# Patient Record
Sex: Female | Born: 1935 | Race: Black or African American | Hispanic: No | Smoking: Never smoker
Health system: Southern US, Community
[De-identification: ages and names within clinical notes are randomized; demographics above are authoritative.]

## PROBLEM LIST (undated history)

## (undated) DIAGNOSIS — Q809 Congenital ichthyosis, unspecified: Secondary | ICD-10-CM

## (undated) DIAGNOSIS — J449 Chronic obstructive pulmonary disease, unspecified: Secondary | ICD-10-CM

## (undated) DIAGNOSIS — J45909 Unspecified asthma, uncomplicated: Secondary | ICD-10-CM

## (undated) DIAGNOSIS — M1990 Unspecified osteoarthritis, unspecified site: Secondary | ICD-10-CM

## (undated) DIAGNOSIS — K922 Gastrointestinal hemorrhage, unspecified: Secondary | ICD-10-CM

## (undated) DIAGNOSIS — I10 Essential (primary) hypertension: Secondary | ICD-10-CM

## (undated) DIAGNOSIS — G4700 Insomnia, unspecified: Secondary | ICD-10-CM

## (undated) DIAGNOSIS — I4891 Unspecified atrial fibrillation: Secondary | ICD-10-CM

## (undated) DIAGNOSIS — I509 Heart failure, unspecified: Secondary | ICD-10-CM

## (undated) DIAGNOSIS — G629 Polyneuropathy, unspecified: Secondary | ICD-10-CM

## (undated) DIAGNOSIS — H3530 Unspecified macular degeneration: Secondary | ICD-10-CM

## (undated) DIAGNOSIS — I214 Non-ST elevation (NSTEMI) myocardial infarction: Secondary | ICD-10-CM

## (undated) DIAGNOSIS — E785 Hyperlipidemia, unspecified: Secondary | ICD-10-CM

## (undated) DIAGNOSIS — E790 Hyperuricemia without signs of inflammatory arthritis and tophaceous disease: Secondary | ICD-10-CM

## (undated) DIAGNOSIS — M810 Age-related osteoporosis without current pathological fracture: Secondary | ICD-10-CM

## (undated) DIAGNOSIS — E119 Type 2 diabetes mellitus without complications: Secondary | ICD-10-CM

## (undated) DIAGNOSIS — H409 Unspecified glaucoma: Secondary | ICD-10-CM

---

## 2007-05-06 ENCOUNTER — Ambulatory Visit: Payer: Self-pay | Admitting: Rheumatology

## 2007-05-27 ENCOUNTER — Ambulatory Visit: Payer: Self-pay | Admitting: Emergency Medicine

## 2007-06-12 ENCOUNTER — Encounter: Payer: Self-pay | Admitting: Emergency Medicine

## 2007-07-13 ENCOUNTER — Encounter: Payer: Self-pay | Admitting: Emergency Medicine

## 2007-07-31 ENCOUNTER — Ambulatory Visit: Payer: Self-pay | Admitting: Pain Medicine

## 2007-08-13 ENCOUNTER — Ambulatory Visit: Payer: Self-pay | Admitting: Pain Medicine

## 2007-08-28 ENCOUNTER — Ambulatory Visit: Payer: Self-pay | Admitting: Physician Assistant

## 2007-10-03 ENCOUNTER — Ambulatory Visit: Payer: Self-pay | Admitting: Pain Medicine

## 2007-10-22 ENCOUNTER — Ambulatory Visit: Payer: Self-pay | Admitting: Physician Assistant

## 2007-11-04 ENCOUNTER — Ambulatory Visit: Payer: Self-pay | Admitting: Pain Medicine

## 2007-11-05 ENCOUNTER — Ambulatory Visit: Payer: Self-pay | Admitting: Pain Medicine

## 2007-11-20 ENCOUNTER — Ambulatory Visit: Payer: Self-pay | Admitting: Physician Assistant

## 2007-12-03 ENCOUNTER — Ambulatory Visit: Payer: Self-pay | Admitting: Pain Medicine

## 2007-12-31 ENCOUNTER — Ambulatory Visit: Payer: Self-pay | Admitting: Physician Assistant

## 2008-01-06 ENCOUNTER — Encounter: Payer: Self-pay | Admitting: Pain Medicine

## 2008-01-10 ENCOUNTER — Encounter: Payer: Self-pay | Admitting: Pain Medicine

## 2008-02-10 ENCOUNTER — Encounter: Payer: Self-pay | Admitting: Pain Medicine

## 2008-02-22 IMAGING — CR PELVIS - 1-2 VIEW
1 series · 1 of 1 positions shown · non-contrast
Comparison: none

REASON FOR EXAM: pain in buttocks w/o injury
COMMENTS:

PROCEDURE:     MDR - MDR PELVIS AP ONLY  - May 27, 2007  [DATE]
RESULT:     No fracture or other significant osseous abnormality is seen. No
lytic or blastic lesions are noted. The hip joint spaces are bilaterally
symmetrical.

[view not recorded]
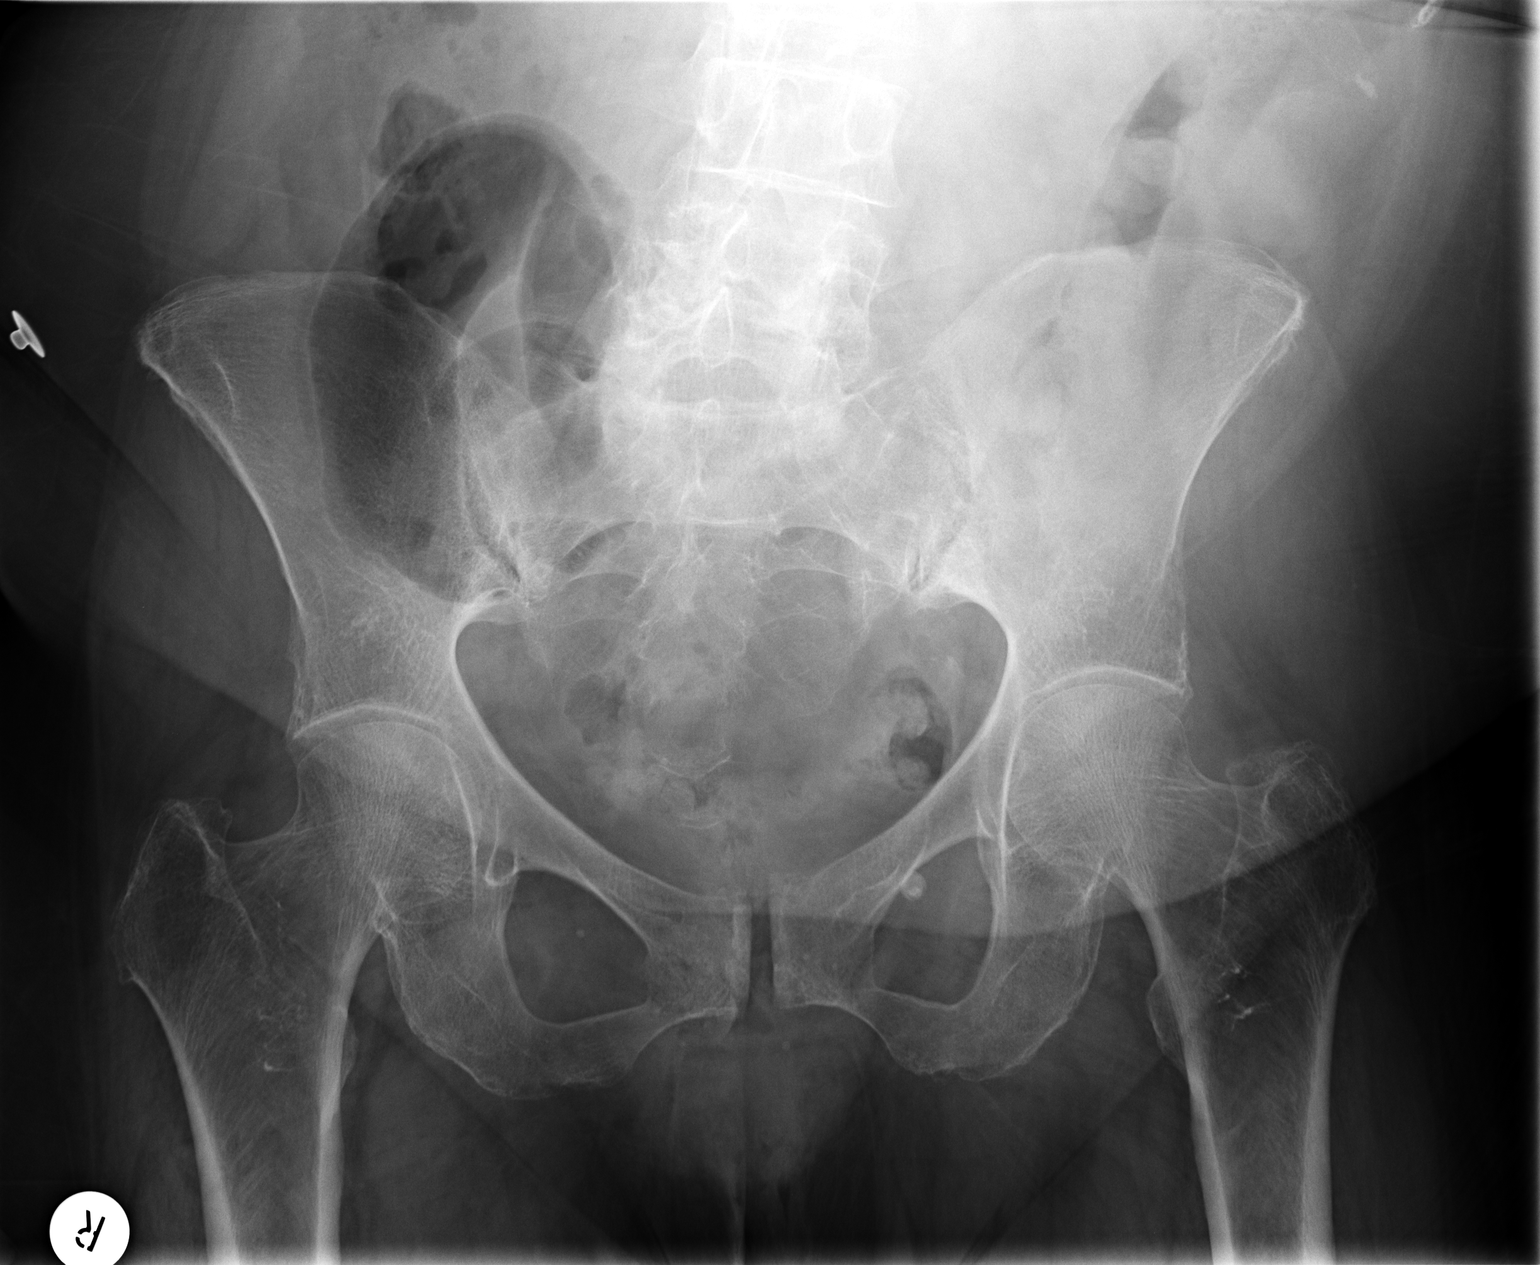

[1 of 1 positions shown; findings below may reference images not displayed]

IMPRESSION: 1.     No acute changes are identified.

## 2008-02-22 IMAGING — CR RIGHT HIP - COMPLETE 2+ VIEW
1 series · 2 of 2 positions shown · non-contrast
Comparison: none

REASON FOR EXAM: pain in hip w/o injury
COMMENTS:

PROCEDURE:     MDR - MDR HIP RIGHT COMPLETE  - May 27, 2007  [DATE]
RESULT:      No fracture, dislocation or other acute bony abnormality is
seen. No lytic or blastic lesions are noted.

[Series 1: view not recorded · 0.17mm/px · 2 of 2 slices shown]
[im 1/2]
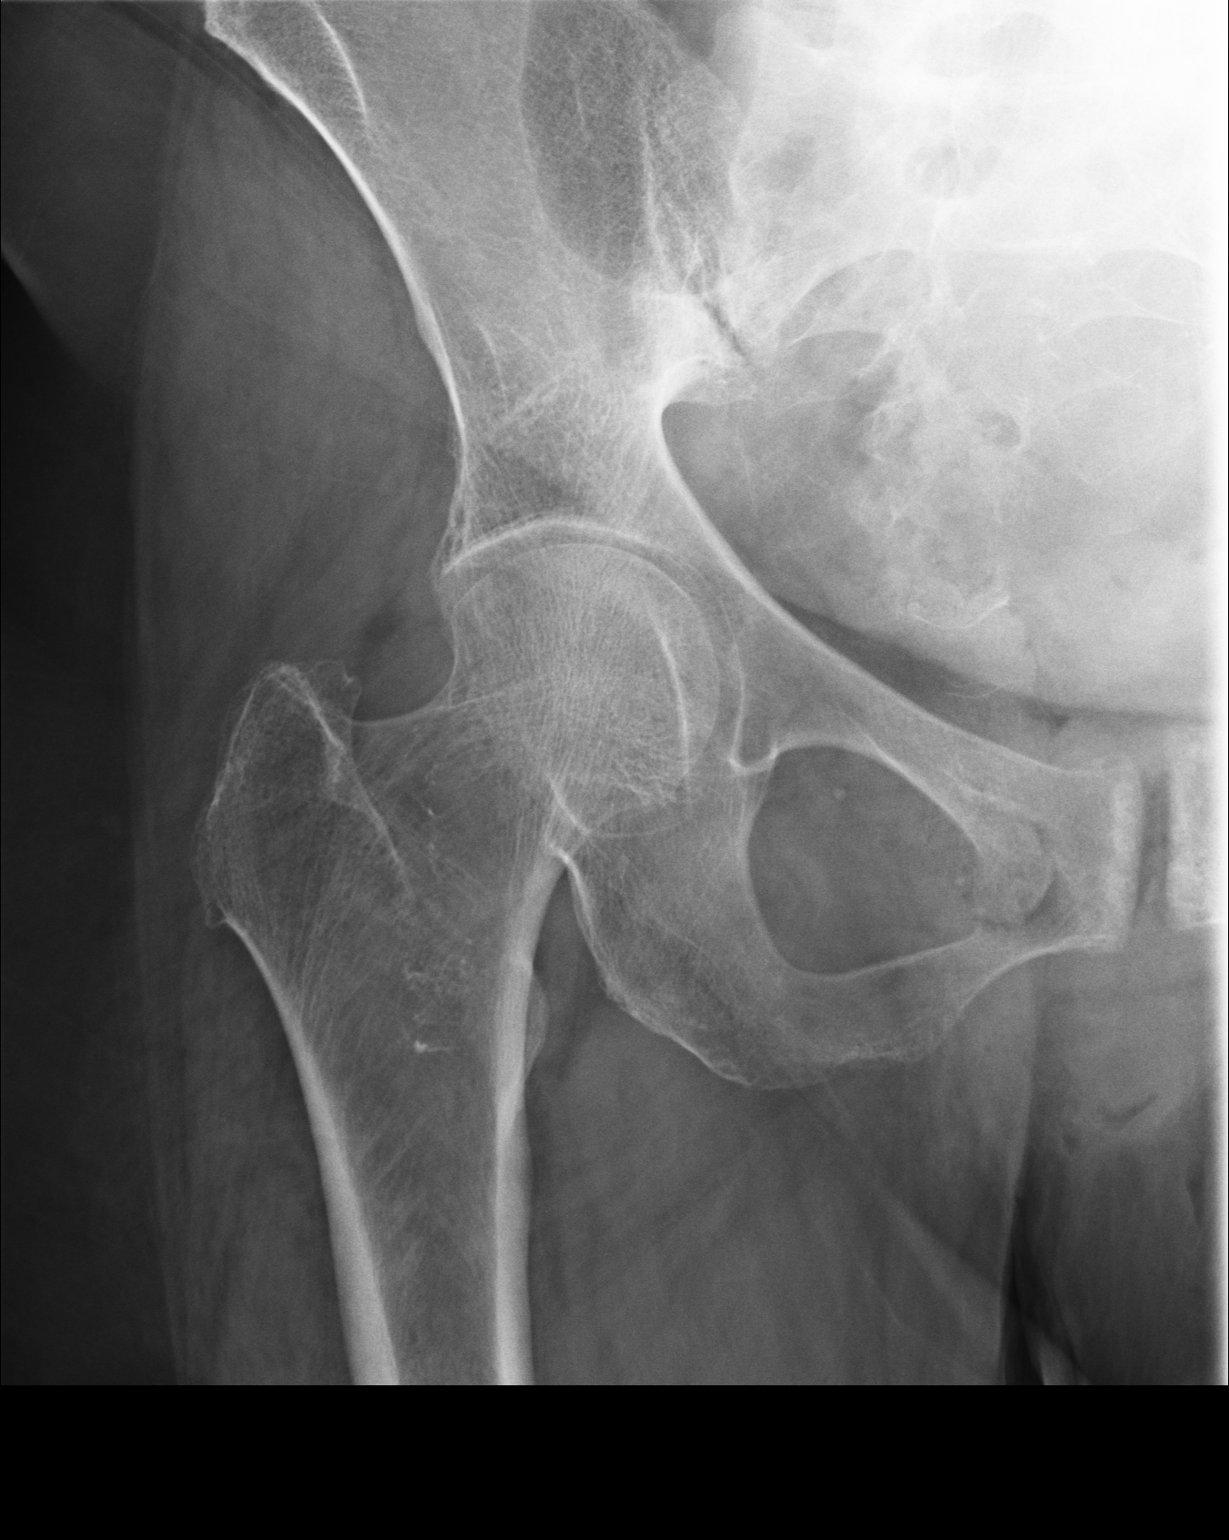
[im 2/2]
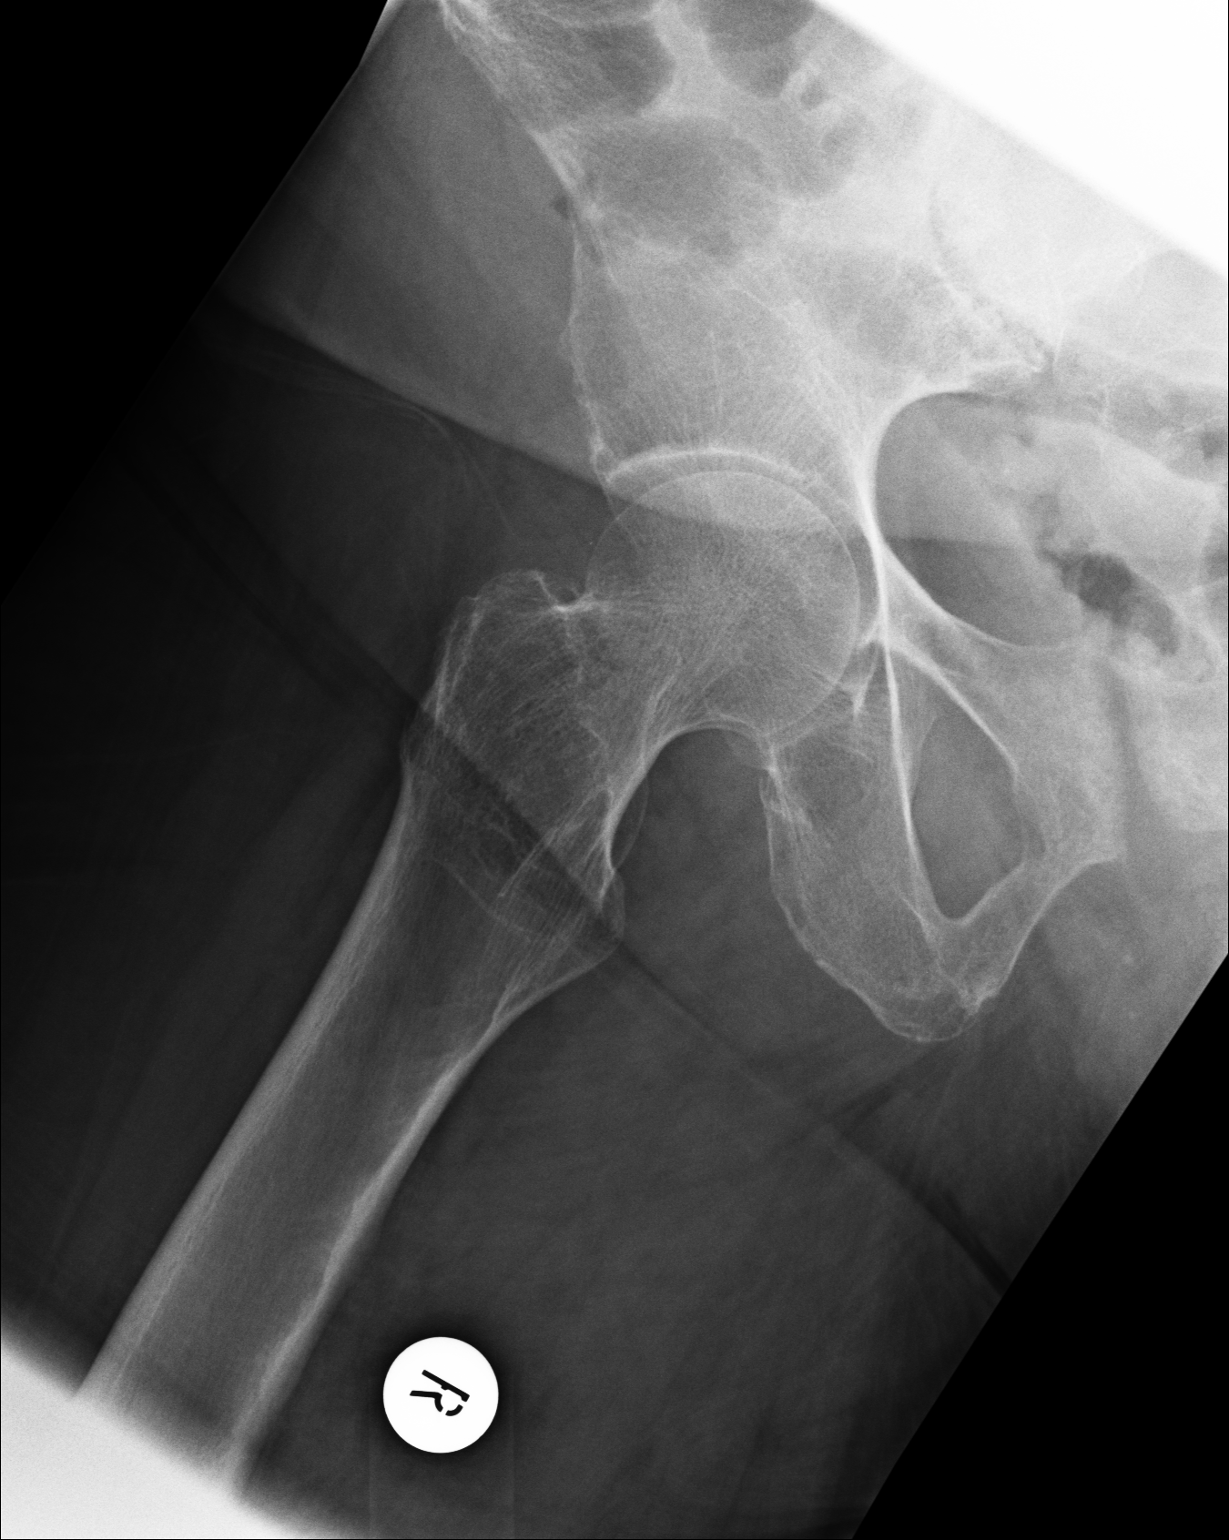

[2 of 2 positions shown; findings below may reference images not displayed]

IMPRESSION: No significant osseous abnormalities are noted.

## 2008-06-12 ENCOUNTER — Other Ambulatory Visit: Payer: Self-pay

## 2008-06-12 ENCOUNTER — Ambulatory Visit: Payer: Self-pay | Admitting: Family Medicine

## 2008-06-12 ENCOUNTER — Inpatient Hospital Stay: Payer: Self-pay | Admitting: Vascular Surgery

## 2008-06-20 ENCOUNTER — Other Ambulatory Visit: Payer: Self-pay

## 2008-06-20 ENCOUNTER — Inpatient Hospital Stay: Payer: Self-pay | Admitting: Internal Medicine

## 2008-06-21 ENCOUNTER — Other Ambulatory Visit: Payer: Self-pay

## 2008-07-21 ENCOUNTER — Ambulatory Visit: Payer: Self-pay | Admitting: Unknown Physician Specialty

## 2011-11-13 ENCOUNTER — Encounter (INDEPENDENT_AMBULATORY_CARE_PROVIDER_SITE_OTHER): Payer: Medicare Other | Admitting: Ophthalmology

## 2011-11-13 DIAGNOSIS — E1139 Type 2 diabetes mellitus with other diabetic ophthalmic complication: Secondary | ICD-10-CM

## 2011-11-13 DIAGNOSIS — H43819 Vitreous degeneration, unspecified eye: Secondary | ICD-10-CM

## 2011-11-13 DIAGNOSIS — E11319 Type 2 diabetes mellitus with unspecified diabetic retinopathy without macular edema: Secondary | ICD-10-CM

## 2011-11-13 DIAGNOSIS — H3530 Unspecified macular degeneration: Secondary | ICD-10-CM

## 2012-11-18 ENCOUNTER — Ambulatory Visit (INDEPENDENT_AMBULATORY_CARE_PROVIDER_SITE_OTHER): Payer: Medicare Other | Admitting: Ophthalmology

## 2013-01-16 ENCOUNTER — Emergency Department: Payer: Self-pay | Admitting: Emergency Medicine

## 2013-02-17 ENCOUNTER — Encounter: Payer: Self-pay | Admitting: Family Medicine

## 2013-03-11 ENCOUNTER — Encounter: Payer: Self-pay | Admitting: Family Medicine

## 2014-05-08 ENCOUNTER — Ambulatory Visit: Payer: Self-pay | Admitting: Unknown Physician Specialty

## 2014-07-29 ENCOUNTER — Ambulatory Visit: Payer: Self-pay | Admitting: Unknown Physician Specialty

## 2014-09-27 LAB — COMPREHENSIVE METABOLIC PANEL
ALK PHOS: 58 U/L
ALT: 20 U/L
Albumin: 4 g/dL (ref 3.4–5.0)
Anion Gap: 13 (ref 7–16)
BUN: 22 mg/dL — ABNORMAL HIGH (ref 7–18)
Bilirubin,Total: 1.1 mg/dL — ABNORMAL HIGH (ref 0.2–1.0)
CO2: 24 mmol/L (ref 21–32)
CREATININE: 1.38 mg/dL — AB (ref 0.60–1.30)
Calcium, Total: 9.5 mg/dL (ref 8.5–10.1)
Chloride: 105 mmol/L (ref 98–107)
EGFR (African American): 46 — ABNORMAL LOW
EGFR (Non-African Amer.): 38 — ABNORMAL LOW
Glucose: 183 mg/dL — ABNORMAL HIGH (ref 65–99)
Osmolality: 291 (ref 275–301)
Potassium: 4.2 mmol/L (ref 3.5–5.1)
SGOT(AST): 39 U/L — ABNORMAL HIGH (ref 15–37)
Sodium: 142 mmol/L (ref 136–145)
Total Protein: 7.3 g/dL (ref 6.4–8.2)

## 2014-09-27 LAB — CBC WITH DIFFERENTIAL/PLATELET
Basophil #: 0 10*3/uL (ref 0.0–0.1)
Basophil %: 0.2 %
EOS ABS: 0.1 10*3/uL (ref 0.0–0.7)
Eosinophil %: 0.6 %
HCT: 45.6 % (ref 35.0–47.0)
HGB: 14.2 g/dL (ref 12.0–16.0)
LYMPHS PCT: 2.8 %
Lymphocyte #: 0.4 10*3/uL — ABNORMAL LOW (ref 1.0–3.6)
MCH: 28.8 pg (ref 26.0–34.0)
MCHC: 31.2 g/dL — ABNORMAL LOW (ref 32.0–36.0)
MCV: 92 fL (ref 80–100)
MONOS PCT: 6 %
Monocyte #: 0.8 x10 3/mm (ref 0.2–0.9)
NEUTROS ABS: 12.2 10*3/uL — AB (ref 1.4–6.5)
Neutrophil %: 90.4 %
Platelet: 233 10*3/uL (ref 150–440)
RBC: 4.95 10*6/uL (ref 3.80–5.20)
RDW: 14.6 % — ABNORMAL HIGH (ref 11.5–14.5)
WBC: 13.5 10*3/uL — ABNORMAL HIGH (ref 3.6–11.0)

## 2014-09-27 LAB — LIPASE, BLOOD: Lipase: 68 U/L — ABNORMAL LOW

## 2014-09-27 LAB — TROPONIN I: Troponin-I: <0.02 ng/mL

## 2014-09-28 ENCOUNTER — Observation Stay: Payer: Self-pay | Admitting: Specialist

## 2014-09-28 LAB — URINALYSIS, COMPLETE
BACTERIA: NONE SEEN
Bilirubin,UR: NEGATIVE
Blood: NEGATIVE
GLUCOSE, UR: NEGATIVE mg/dL (ref 0–75)
Hyaline Cast: 46
KETONE: NEGATIVE
LEUKOCYTE ESTERASE: NEGATIVE
Nitrite: NEGATIVE
Ph: 5 (ref 4.5–8.0)
Protein: NEGATIVE
SQUAMOUS EPITHELIAL: NONE SEEN
Specific Gravity: 1.017 (ref 1.003–1.030)

## 2014-09-28 LAB — CLOSTRIDIUM DIFFICILE(ARMC)

## 2014-09-29 LAB — BASIC METABOLIC PANEL
Anion Gap: 5 — ABNORMAL LOW (ref 7–16)
BUN: 16 mg/dL (ref 7–18)
Calcium, Total: 8 mg/dL — ABNORMAL LOW (ref 8.5–10.1)
Chloride: 111 mmol/L — ABNORMAL HIGH (ref 98–107)
Co2: 27 mmol/L (ref 21–32)
Creatinine: 1.08 mg/dL (ref 0.60–1.30)
EGFR (African American): >60
EGFR (Non-African Amer.): 51 — ABNORMAL LOW
Glucose: 97 mg/dL (ref 65–99)
Osmolality: 286 (ref 275–301)
Potassium: 4 mmol/L (ref 3.5–5.1)
Sodium: 143 mmol/L (ref 136–145)

## 2014-09-29 LAB — TSH: Thyroid Stimulating Horm: 0.929 u[IU]/mL

## 2015-01-10 NOTE — H&P (Signed)
PATIENT NAME:  Jodi Lewis, MCBETH MR#:  [id number] DATE OF BIRTH:  Feb 06, 1923  DATE OF ADMISSION:  09/28/2014  REFERRING PHYSICIAN: Rebecka Apley, M.D.   PRIMARY CARE PHYSICIAN: Volney American, M.D.   ADMIT DIAGNOSIS: Dehydration, acute kidney injury and lactic acidosis.   HISTORY OF PRESENT ILLNESS: This is an 81 year old, African American woman, who presents to the Emergency Department complaining of nausea and abdominal pain. The patient states that she had vomited multiple times throughout the day. Her emesis was nonbloody and nonbilious. She thinks that she ate some cornbread that may have been too greasy for her stomach. Nobody else in the house is sick. She has no sick contacts. Her laboratory evaluation revealed some acute kidney injury and physical examination showed dehydration. Following intravenous fluid, the patient was feeling much better. She no longer had abdominal pain, nor any nausea or vomiting. However, her lactic acid level was fairly high. This is in light of the fact that when the patient arrived, she was in atrial fibrillation with RVR, which also resolved after intravenous fluid. Due to a concern for tachyarrhythmia and its connection to her lactic acidosis, the Emergency Department called for admission.   REVIEW OF SYSTEMS:   CONSTITUTIONAL: The patient denies fever, but admits to some generalized weakness.  EYES: Admits to blurred vision, but this is a chronic problem, but denies inflammation.  ENT: Denies tinnitus or sore throat.  RESPIRATORY: Denies shortness of breath or cough.  CARDIOVASCULAR: Denies chest pain, orthopnea, or paroxysmal nocturnal dyspnea. The patient admits to palpitations earlier in the evening.  GASTROINTESTINAL: Admits to nausea and vomiting, as well as abdominal pain and diarrhea, all of which have resolved.  ENDOCRINE: Denies polyuria or polydipsia.  HEMATOLOGIC AND LYMPHATIC: Denies easy bruising or bleeding.  MUSCULOSKELETAL: Admits to  generalized aches and pains, but denies myalgias.  INTEGUMENTARY: Denies rashes or lesions.  NEUROLOGIC: Denies numbness in her extremities or dysarthria.  PSYCHIATRIC: Denies depression or suicidal ideation.   PAST MEDICAL HISTORY: Congestive heart failure, coronary artery disease status post myocardial infarction, hypertension, diabetes type 2, COPD, atrial fibrillation, hyperlipidemia, glaucoma, macular degeneration, osteoporosis, history of GI bleed, osteoarthritis, as well as xeroderma and hyperuricemia.   PAST SURGICAL HISTORY: Bilateral cataract removal.   SOCIAL HISTORY: The patient does not smoke, drink, or do any drugs. She lives with one of her daughters.   FAMILY HISTORY: Her son has diabetes mellitus.   MEDICATIONS:  1. Albuterol 90 mcg per actuation 2 puffs inhaled every 4 to 6 hours as needed for coughing, wheezing, or shortness of breath.  2. Beclomethasone 80 mcg/inhalation 2 puffs inhaled 2 times a day.  3. Calcium carbonate 600 mg 1 tablet p.o. b.i.d.  4. Folic acid 1 mg 1 tablet p.o. daily.  5. Furosemide 40 mg 1 tablet p.o. daily.  6. Omeprazole 20 mg delayed release 1 tablet p.o. daily.  7. Percocet 5/325 mg 1 tablet p.o. every 6 hours as needed for pain.  8. Potassium chloride 20 mEq extended release 1 tablet p.o. daily.  9. Quinapril 5 mg 1 tablet p.o. daily.  10. Simvastatin 20 mg 1 tablet p.o. daily.  11. Xalatan 0.005% ophthalmic solution 1 drop to each affected eye 3 times a day.  12. Zoledronic acid 5 mg/100 mL intravenous solution injected once a year, every January.   ALLERGIES: No known drug allergies.   PERTINENT LABORATORY RESULTS AND RADIOGRAPHIC FINDINGS: Serum glucose is 183, BUN 22, creatinine 1.38, serum sodium 142, potassium is 4.2, chloride is 105,  bicarbonate 24, calcium 9.5, lipase is 68, serum albumin is 4, alkaline phosphatase 58, AST 39, ALT 20.   Troponin is negative.   White blood cell count is 13.5, hemoglobin is 14.2, hematocrit 45.6,  MCV 92.   Urinalysis is negative for infection.   Clostridium difficile antigen is negative.   Venous lactic acid is 5.9.   CT of the abdomen and pelvis without contrast shows no acute intra-abdominal or pelvic process. There is diverticulosis without acute diverticulitis. There is also a 3.3 cm infrarenal aortic aneurysm extending to the common iliac artery. There is a large right diaphragmatic hernia, as well as a moderate to large hiatal hernia.   PHYSICAL EXAMINATION:  VITAL SIGNS: Temperature is 98.6, pulse 79, respirations 18, blood pressure 105/63, pulse oximetry is 98% on room air.  GENERAL: The patient is alert and oriented x 3, in no apparent distress.  HEENT: Normocephalic, atraumatic. Pupils equal, round, and reactive to light and accommodation. Extraocular movements are intact. Mucous membranes are moist.  NECK: Trachea is midline. No adenopathy. Thyroid is nonpalpable, nontender.  CHEST: Symmetric and atraumatic.  CARDIOVASCULAR: Regular rate and rhythm. Normal S1, S2. No rubs, clicks, or murmurs appreciated.  LUNGS: Clear to auscultation bilaterally. Normal effort and excursion.  ABDOMEN: Positive bowel sounds. Soft, nontender, nondistended. No hepatosplenomegaly. There are some ventral wall defects, but all hernias are reducible.  GENITOURINARY: Deferred.  MUSCULOSKELETAL: The patient has 5/5 strength in her lower extremities bilaterally. I have not tested her gait.  SKIN: Warm and dry. No rashes or lesions.  EXTREMITIES: No clubbing or cyanosis, but the patient has 2+ pitting edema of her lower extremities bilaterally.  NEUROLOGIC: Cranial nerves II through XII are grossly intact.  PSYCHIATRIC: Mood is normal. Affect is congruent. The patient has excellent insight and judgment into her medical condition.   ASSESSMENT AND PLAN: This is an 81 year old female admitted for dehydration, acute kidney injury, and lactic acidosis.   1. Dehydration, is likely secondary to vomiting  and diarrhea. Both symptoms have resolved and we will continue intravenous fluid at maintenance rate, at this time.  2. Acute kidney injury. This is likely secondary to dehydration. We will continue to hydrate the patient.  3. Lactic acidosis. The patient likely just has not cleared her lactic acid as quickly as one might expect. This is likely due to her advanced age. She probably has not yet fully volume resuscitated, which is why we will continue to give her intravenous fluid, being mindful of the fact that she has congestive heart failure. There is another concern, however, and that is that she may have suffered an embolic event to her gut while she was in atrial fibrillation with rapid ventricular response. Her physical examination is completely reassuring. There is no pain, at all, let alone pain that is out of proportion with physical examination. We will recheck her lactic acid a few hours after we think she is fully resuscitated. If she develops pain on serial abdominal examinations, or if the lactic acid continues to increase, we may need to perform a CT angiogram of the abdomen to rule out mesenteric ischemia. At this juncture, I feel like this is a low possibility.  4. Congestive heart failure is stable. This appears diastolic. We will continue Lasix per her home regimen.  5. Coronary artery disease. Continue secondary risk reduction.  6. Hypertension. Continue enalapril.  7. Diabetes type 2, sliding scale insulin while the patient is hospitalized.  8. Chronic obstructive pulmonary disease. Continue inhaled corticosteroid  therapy. We will continue albuterol, as needed.  9. Atrial fibrillation. Presumably the patient is not on any anticoagulant right now due her fall risk and/or history of gastrointestinal bleed. I will not place the patient on systemic anticoagulation; however, we will give her deep vein thrombosis prophylaxis in the form of heparin.  10. Glaucoma. Continue latanoprost.   11. Deep vein thrombosis prophylaxis, heparin.  12. Gastrointestinal prophylaxis, none.   CODE STATUS: The patient is a FULL CODE.   TIME SPENT ON ADMISSION ORDERS AND PATIENT CARE: Approximately 40 minutes.   ____________________________ Kelton PillarMichael S. Sheryle Hailiamond, MD msd:JT D: 09/28/2014 08:52:27 ET T: 09/28/2014 09:10:09 ET JOB#: [id number]  cc: Kelton PillarMichael S. Sheryle Hailiamond, MD, <Dictator>  Kelton PillarMICHAEL S Terria Deschepper MD ELECTRONICALLY SIGNED 10/06/2014 2:44

## 2015-01-10 NOTE — Discharge Summary (Signed)
PATIENT NAME:  Jodi Lewis, Akira P MR#:  [id number] DATE OF BIRTH:  1923/08/30  DATE OF ADMISSION:  09/28/2014 DATE OF DISCHARGE:  09/29/2014  For a detailed note, please see the history and physical done on admission by Dr. Kelton PillarMichael S. Diamond.   DIAGNOSES AT DISCHARGE: As follows: 1.  Diarrhea, likely secondary to a viral illness now resolved.  2.  Nausea, vomiting, abdominal pain, likely secondary to a viral illness also now resolved.  3.  Acute renal failure, likely secondary to her diarrhea improved.  4.  Dehydration.  5.  Hypertension.  6.  Glaucoma.   DISCHARGE DIET:  The patient is being discharged on a low-sodium, low-fat diet.   ACTIVITY: As tolerated.   FOLLOW-UP:  Dr. Trula Orehristina Drostin in the next 1 to 2 weeks.   DISCHARGE MEDICATIONS: Albuterol inhaler 2 puffs every 4 to 6 hours as needed, beclomethasone 80 mcg inhaled 2 puffs b.i.d., calcium carbonate 600 mg b.i.d., folic acid 1 mg daily, Lasix 40 mg daily, Xalatan 0.005% ophthalmic solution t.i.d. omeprazole 20 mg daily, Percocet 5/325 one tablet q.6 hours as needed, potassium 20 mEq daily, quinapril 5 mg daily, simvastatin 20 mg daily, zoledronic acid 5 mg intravenous every year in January.   Pertinent studies done during the hospital course:  Stool for Clostridium difficile noted to be negative, a CT scan of the abdomen and pelvis done with contrast showing no evidence of any intra-abdominal or pelvic pathology. Large right diaphragmatic hernia, moderate to large hiatal hernia, infrarenal aortic aneurysm extending to the common iliac.  Recommend follow-up and ultrasound in three years.   HOSPITAL COURSE: This is an 81 year old female who presented to the hospital with nausea, vomiting, abdominal pain, and diarrhea, noted to be in mild acute renal failure.   PROBLEMS: 1.  Diarrhea. This was likely acute diarrhea secondary to a viral illness. The patient had stool for Clostridium difficile checked, which were negative. The patient  was treated supportively with IV fluids and anti-diarrheal is likely Imodium. Her diarrhea overnight has significantly improved. She is tolerating now a regular diet, therefore, being discharged home.  2.  Abdominal pain, nausea, and vomiting. This is also secondary to the viral illness and this has significantly improved and resolved with supportive care.  As mentioned the patient's stool for Clostridium difficile was negative. The patient's diet has been slowly advanced from a clear liquid eventually to a regular diet, which she is tolerating well without any evidence of further abdominal pain, nausea or vomiting.  3.  Acute renal failure. This was likely secondary to diarrhea. This has now improved and resolved with IV fluids.  4.  Hypertension. The patient remained hemodynamically stable. She will continue her quinapril.  5.  Hyperlipidemia. The patient was maintained on her simvastatin, she will resume that.  6.  Glaucoma. The patient was maintained on latanoprost eye drops and she will also resume that upon discharge.   CODE STATUS:  The patient is a full code.   TIME SPENT: With the discharge was 35 minutes.   ____________________________ Rolly PancakeVivek J. Cherlynn KaiserSainani, MD vjs:at D: 09/29/2014 15:38:00 ET T: 09/29/2014 17:09:49 ET JOB#: [id number]  cc: Rolly PancakeVivek J. Cherlynn KaiserSainani, MD, <Dictator> Volney Americanhristina Drostin, MD [place] SirenVIVEK J Nathaniel Wakeley MD ELECTRONICALLY SIGNED 10/06/2014 11:54

## 2020-04-10 ENCOUNTER — Encounter: Payer: Self-pay | Admitting: Emergency Medicine

## 2020-04-10 ENCOUNTER — Other Ambulatory Visit: Payer: Self-pay

## 2020-04-10 ENCOUNTER — Emergency Department
Admission: EM | Admit: 2020-04-10 | Discharge: 2020-04-11 | Disposition: A | Discharge status: Home / Self Care | Payer: Medicare Other | Attending: Emergency Medicine | Admitting: Emergency Medicine

## 2020-04-10 ENCOUNTER — Emergency Department: Payer: Medicare Other

## 2020-04-10 DIAGNOSIS — F17200 Nicotine dependence, unspecified, uncomplicated: Secondary | ICD-10-CM | POA: Insufficient documentation

## 2020-04-10 DIAGNOSIS — E119 Type 2 diabetes mellitus without complications: Secondary | ICD-10-CM | POA: Diagnosis not present

## 2020-04-10 DIAGNOSIS — J449 Chronic obstructive pulmonary disease, unspecified: Secondary | ICD-10-CM | POA: Diagnosis not present

## 2020-04-10 DIAGNOSIS — I82412 Acute embolism and thrombosis of left femoral vein: Secondary | ICD-10-CM | POA: Insufficient documentation

## 2020-04-10 DIAGNOSIS — Z7901 Long term (current) use of anticoagulants: Secondary | ICD-10-CM | POA: Insufficient documentation

## 2020-04-10 DIAGNOSIS — R600 Localized edema: Secondary | ICD-10-CM | POA: Diagnosis not present

## 2020-04-10 DIAGNOSIS — M7989 Other specified soft tissue disorders: Secondary | ICD-10-CM | POA: Diagnosis present

## 2020-04-10 DIAGNOSIS — J45909 Unspecified asthma, uncomplicated: Secondary | ICD-10-CM | POA: Diagnosis not present

## 2020-04-10 DIAGNOSIS — I509 Heart failure, unspecified: Secondary | ICD-10-CM | POA: Diagnosis not present

## 2020-04-10 DIAGNOSIS — I110 Hypertensive heart disease with heart failure: Secondary | ICD-10-CM | POA: Insufficient documentation

## 2020-04-10 HISTORY — DX: Polyneuropathy, unspecified: G62.9

## 2020-04-10 HISTORY — DX: Unspecified osteoarthritis, unspecified site: M19.90

## 2020-04-10 HISTORY — DX: Unspecified asthma, uncomplicated: J45.909

## 2020-04-10 HISTORY — DX: Essential (primary) hypertension: I10

## 2020-04-10 HISTORY — DX: Unspecified atrial fibrillation: I48.91

## 2020-04-10 HISTORY — DX: Hyperuricemia without signs of inflammatory arthritis and tophaceous disease: E79.0

## 2020-04-10 HISTORY — DX: Chronic obstructive pulmonary disease, unspecified: J44.9

## 2020-04-10 HISTORY — DX: Congenital ichthyosis, unspecified: Q80.9

## 2020-04-10 HISTORY — DX: Non-ST elevation (NSTEMI) myocardial infarction: I21.4

## 2020-04-10 HISTORY — DX: Hyperlipidemia, unspecified: E78.5

## 2020-04-10 HISTORY — DX: Type 2 diabetes mellitus without complications: E11.9

## 2020-04-10 HISTORY — DX: Insomnia, unspecified: G47.00

## 2020-04-10 HISTORY — DX: Unspecified macular degeneration: H35.30

## 2020-04-10 HISTORY — DX: Unspecified glaucoma: H40.9

## 2020-04-10 HISTORY — DX: Heart failure, unspecified: I50.9

## 2020-04-10 HISTORY — DX: Age-related osteoporosis without current pathological fracture: M81.0

## 2020-04-10 HISTORY — DX: Gastrointestinal hemorrhage, unspecified: K92.2

## 2020-04-10 LAB — COMPREHENSIVE METABOLIC PANEL
ALT: 21 U/L (ref 0–44)
AST: 35 U/L (ref 15–41)
Albumin: 4.2 g/dL (ref 3.5–5.0)
Alkaline Phosphatase: 71 U/L (ref 38–126)
Anion gap: 10 (ref 5–15)
BUN: 31 mg/dL — ABNORMAL HIGH (ref 8–23)
CO2: 29 mmol/L (ref 22–32)
Calcium: 9.6 mg/dL (ref 8.9–10.3)
Chloride: 104 mmol/L (ref 98–111)
Creatinine, Ser: 1.68 mg/dL — ABNORMAL HIGH (ref 0.44–1.00)
GFR calc Af Amer: 30 mL/min — ABNORMAL LOW (ref >60)
GFR calc non Af Amer: 26 mL/min — ABNORMAL LOW (ref >60)
Glucose, Bld: 128 mg/dL — ABNORMAL HIGH (ref 70–99)
Potassium: 4.4 mmol/L (ref 3.5–5.1)
Sodium: 143 mmol/L (ref 135–145)
Total Bilirubin: 1.1 mg/dL (ref 0.3–1.2)
Total Protein: 6.9 g/dL (ref 6.5–8.1)

## 2020-04-10 LAB — CBC
HCT: 38 % (ref 36.0–46.0)
Hemoglobin: 11.8 g/dL — ABNORMAL LOW (ref 12.0–15.0)
MCH: 27.8 pg (ref 26.0–34.0)
MCHC: 31.1 g/dL (ref 30.0–36.0)
MCV: 89.6 fL (ref 80.0–100.0)
Platelets: 196 10*3/uL (ref 150–400)
RBC: 4.24 MIL/uL (ref 3.87–5.11)
RDW: 14.8 % (ref 11.5–15.5)
WBC: 6.1 10*3/uL (ref 4.0–10.5)
nRBC: 0 % (ref 0.0–0.2)

## 2020-04-10 LAB — TROPONIN I (HIGH SENSITIVITY): Troponin I (High Sensitivity): 15 ng/L (ref <18)

## 2020-04-10 LAB — BRAIN NATRIURETIC PEPTIDE: B Natriuretic Peptide: 169.3 pg/mL — ABNORMAL HIGH (ref 0.0–100.0)

## 2020-04-10 MED ORDER — FUROSEMIDE 10 MG/ML IJ SOLN
40.0000 mg | Freq: Once | INTRAMUSCULAR | Status: AC
  Administered 2020-04-11: 40 mg via INTRAVENOUS
  Filled 2020-04-10: qty 4

## 2020-04-10 NOTE — ED Provider Notes (Signed)
[facility] Emergency Department Provider Note  ____________________________________________  Time seen: Approximately 11:48 PM  I have reviewed the triage vital signs and the nursing notes.   HISTORY  Chief Complaint Leg Swelling   HPI Jodi Lewis is a 86 y.o. female with a history of CHF, COPD, asthma, hypertension, hyperlipidemia who presents for evaluation of bilateral leg swelling.  Patient reports chronic leg swelling however since yesterday, the swelling has been getting progressively worse.   She endorses compliance with her Lasix which she takes 40 mg daily.  Has been making normal amount of urine.  No shortness of breath, orthopnea, chest pain.  She denies any prior history of DVT or PE.  She is complaining that the swelling is now severe.  Past Medical History:  Diagnosis Date  . Arthritis   . Asthma   . Atrial fibrillation (HCC)   . CHF (congestive heart failure) (HCC)   . COPD (chronic obstructive pulmonary disease) (HCC)   . Diabetes mellitus without complication (HCC)   . GI bleed   . Glaucoma   . Hyperlipidemia   . Hypertension   . Hyperuricemia   . Insomnia   . Macular degeneration   . MI, acute, non ST segment elevation (HCC)   . Neuropathy   . Osteoporosis   . Xeroderma     There are no problems to display for this patient.   History reviewed. No pertinent surgical history.  Prior to Admission medications   Medication Sig Start Date End Date Taking? Authorizing Provider  apixaban (ELIQUIS) 2.5 MG TABS tablet Take 1 tablet (2.5 mg total) by mouth 2 (two) times daily. 04/11/20   Nita SickleVeronese, [place], MD    Allergies Patient has no known allergies.  No family history on file.  Social History Social History   Tobacco Use  . Smoking status: Never Smoker  . Smokeless tobacco: Current User    Types: Snuff  Substance Use Topics  . Alcohol use: Not Currently  . Drug use: Never    Review of  Systems  Constitutional: Negative for fever. Eyes: Negative for visual changes. ENT: Negative for sore throat. Neck: No neck pain  Cardiovascular: Negative for chest pain. Respiratory: Negative for shortness of breath. Gastrointestinal: Negative for abdominal pain, vomiting or diarrhea. Genitourinary: Negative for dysuria. Musculoskeletal: Negative for back pain. + Bilateral leg swelling Skin: Negative for rash. Neurological: Negative for headaches, weakness or numbness. Psych: No SI or HI  ____________________________________________   PHYSICAL EXAM:  VITAL SIGNS: ED Triage Vitals  Enc Vitals Group     BP 04/10/20 1926 101/76     Pulse Rate 04/10/20 1926 80     Resp 04/10/20 1926 18     Temp 04/10/20 1926 99 F (37.2 C)     Temp Source 04/10/20 1926 Oral     SpO2 04/10/20 1926 98 %     Weight 04/10/20 1927 139 lb (63 kg)     Height 04/10/20 1927 5\' 3"  (1.6 m)     Head Circumference --      Peak Flow --      Pain Score 04/10/20 1926 10     Pain Loc --      Pain Edu? --      Excl. in GC? --     Constitutional: Alert and oriented. Well appearing and in no apparent distress. HEENT:      Head: Normocephalic and atraumatic.         Eyes: Conjunctivae are normal. Sclera  is non-icteric.       Mouth/Throat: Mucous membranes are moist.       Neck: Supple with no signs of meningismus. Cardiovascular: Regular rate and rhythm. No murmurs, gallops, or rubs. Respiratory: Normal respiratory effort. Lungs are clear to auscultation bilaterally.  Gastrointestinal: Soft, non tender. Musculoskeletal: 3+ pitting edema bilaterally with no cyanosis or erythema of extremities. Neurologic: Normal speech and language. Face is symmetric. Moving all extremities. No gross focal neurologic deficits are appreciated. Skin: Skin is warm, dry and intact. No rash noted. Psychiatric: Mood and affect are normal. Speech and behavior are normal.  ____________________________________________    LABS (all labs ordered are listed, but only abnormal results are displayed)  Labs Reviewed  CBC - Abnormal; Notable for the following components:      Result Value   Hemoglobin 11.8 (*)    All other components within normal limits  COMPREHENSIVE METABOLIC PANEL - Abnormal; Notable for the following components:   Glucose, Bld 128 (*)    BUN 31 (*)    Creatinine, Ser 1.68 (*)    GFR calc non Af Amer 26 (*)    GFR calc Af Amer 30 (*)    All other components within normal limits  BRAIN NATRIURETIC PEPTIDE - Abnormal; Notable for the following components:   B Natriuretic Peptide 169.3 (*)    All other components within normal limits  TROPONIN I (HIGH SENSITIVITY)  TROPONIN I (HIGH SENSITIVITY)   ____________________________________________  EKG  ED ECG REPORT I, Nita Sickle, the attending physician, personally viewed and interpreted this ECG.  Normal sinus rhythm with occasional PVCs, rate of 78, normal intervals, left axis deviation, low voltage QRS, no ST elevations or depressions. ____________________________________________  RADIOLOGY  I have personally reviewed the images performed during this visit and I agree with the Radiologist's read.   Interpretation by Radiologist:  DG Chest 2 View  Result Date: 04/10/2020 CLINICAL DATA:  Shortness of breath and leg swelling. EXAM: CHEST - 2 VIEW COMPARISON:  Radiograph 06/21/2008 FINDINGS: Chronic elevation of right hemidiaphragm. Retrocardiac hiatal hernia overlying the left lung base. Heart size is grossly normal, partially obscured. Aortic atherosclerosis. There is no pulmonary edema. No focal airspace disease. No pleural effusion or pneumothorax. Chronic compression fracture in the upper lumbar spine. Bones are diffusely under mineralized. IMPRESSION: 1. No acute abnormality. 2. Chronic elevation of right hemidiaphragm. Retrocardiac hiatal hernia. Electronically Signed   By: Narda Rutherford M.D.   On: 04/10/2020 20:23    US Venous Img Lower Bilateral  Result Date: 04/11/2020 CLINICAL DATA:  Bilateral leg swelling EXAM: BILATERAL LOWER EXTREMITY VENOUS DOPPLER ULTRASOUND TECHNIQUE: Gray-scale sonography with graded compression, as well as color Doppler and duplex ultrasound were performed to evaluate the lower extremity deep venous systems from the level of the common femoral vein and including the common femoral, femoral, profunda femoral, popliteal and calf veins including the posterior tibial, peroneal and gastrocnemius veins when visible. The superficial great saphenous vein was also interrogated. Spectral Doppler was utilized to evaluate flow at rest and with distal augmentation maneuvers in the common femoral, femoral and popliteal veins. COMPARISON:  None. FINDINGS: RIGHT LOWER EXTREMITY Common Femoral Vein: No evidence of thrombus. Normal compressibility, respiratory phasicity and response to augmentation. Saphenofemoral Junction: No evidence of thrombus. Normal compressibility and flow on color Doppler imaging. Profunda Femoral Vein: No evidence of thrombus. Normal compressibility and flow on color Doppler imaging. Femoral Vein: No evidence of thrombus. Normal compressibility, respiratory phasicity and response to augmentation. Popliteal Vein: No evidence  of thrombus. Normal compressibility, respiratory phasicity and response to augmentation. Calf Veins: No evidence of thrombus. Normal compressibility and flow on color Doppler imaging. Superficial Great Saphenous Vein: No evidence of thrombus. Normal compressibility. Venous Reflux:  None. Other Findings:  Calf edema is noted. LEFT LOWER EXTREMITY Common Femoral Vein: No evidence of thrombus. Normal compressibility, respiratory phasicity and response to augmentation. Saphenofemoral Junction: No evidence of thrombus. Normal compressibility and flow on color Doppler imaging. Profunda Femoral Vein: No evidence of thrombus. Normal compressibility and flow on color Doppler  imaging. Femoral Vein: Thrombus is noted with decreased compressibility. This is nonocclusive in nature. Popliteal Vein: No evidence of thrombus. Normal compressibility, respiratory phasicity and response to augmentation. Calf Veins: No evidence of thrombus. Normal compressibility and flow on color Doppler imaging. Superficial Great Saphenous Vein: No evidence of thrombus. Normal compressibility. Venous Reflux:  None. Other Findings:  Calf edema is noted. IMPRESSION: Nonocclusive deep venous thrombosis in the left femoral vein. Electronically Signed   By: Alcide Clever M.D.   On: 04/11/2020 01:12     ____________________________________________   PROCEDURES  Procedure(s) performed:yes .1-3 Lead EKG Interpretation Performed by: Nita Sickle, MD Authorized by: Nita Sickle, MD     Interpretation: non-specific     ECG rate assessment: normal     Rhythm: sinus rhythm     Ectopy: none     Critical Care performed:  None ____________________________________________   INITIAL IMPRESSION / ASSESSMENT AND PLAN / ED COURSE   86 y.o. female with a history of CHF, COPD, asthma, hypertension, hyperlipidemia who presents for evaluation of bilateral leg swelling.  Patient is well-appearing in no distress with normal vitals, normal work of breathing, normal sats, lungs are clear to auscultation, 3+ pitting edema bilaterally with no erythema or warmth.  Differential diagnosis including CHF exacerbation versus lymphedema versus DVT  Creatinine of 1.68 with a GFR of 30.  Unfortunately I am unable to see any recent blood work done by the patient.  In epic, last blood work available is from 2016, where patient's creatinine was 1.08.  No significant electrolyte derangements.  BNP is elevated to 169.  Chest x-ray with no evidence of edema or effusion or cardiomegaly, confirmed by radiology.  We will get Doppler studies to rule out DVT.  We will give a dose of IV Lasix.  If ultrasound is negative  plan to discharge home with compression stockings, elevation of the leg, increase Lasix dose for 2 days, referral to the CHF clinic since patient does not have a cardiologist.  History gathered from patient and her daughter was at bedside.  Plan discussed with both of them.  Old medical records reviewed.  _________________________ 2:15 AM on 04/11/2020 -----------------------------------------  Doppler studies showing a nonocclusive DVT in the left femoral vein.  Patient has no chest pain, no shortness of breath, no hypoxia, no tachypnea, no tachycardia.  She ambulated with no tachycardia or hypoxia.  Discussed risks and benefits of initiating anticoagulation on a 86 year old with patient herself and her daughter who is at bedside.  Patient is currently on an aspirin.  They would like to hold off starting anticoagulation at this time and to the speak with her doctor which I think it is reasonable.  I discussed the risks associated with anticoagulation which include head bleed, GI bleed, hematemesis, hemoptysis, epistaxis which may lead to severe blood loss, hemorrhagic shock, coma, or death.  The risks associated with not starting anticoagulation discussed were PE, extension of DVT, respiratory arrest, cardiac arrest.  Will provide him with a prescription for Eliquis.  Recommended increasing the dose of Lasix for the next 2 days.  Recommended compression stockings from below the knee and elevation of the leg.  Will refer to CHF clinic.  Discussed my standard return precautions for any chest pain or shortness of breath.    _____________________________________________ Please note:  Patient was evaluated in Emergency Department today for the symptoms described in the history of present illness. Patient was evaluated in the context of the global COVID-19 pandemic, which necessitated consideration that the patient might be at risk for infection with the SARS-CoV-2 virus that causes COVID-19. Institutional  protocols and algorithms that pertain to the evaluation of patients at risk for COVID-19 are in a state of rapid change based on information released by regulatory bodies including the CDC and federal and state organizations. These policies and algorithms were followed during the patient's care in the ED.  Some ED evaluations and interventions may be delayed as a result of limited staffing during the pandemic.   [place] Controlled Substance Database was reviewed by me. ____________________________________________   FINAL CLINICAL IMPRESSION(S) / ED DIAGNOSES   Final diagnoses:  Acute deep vein thrombosis (DVT) of femoral vein of left lower extremity (HCC)  Leg edema      NEW MEDICATIONS STARTED DURING THIS VISIT:  ED Discharge Orders         Ordered    apixaban (ELIQUIS) 2.5 MG TABS tablet  2 times daily     Discontinue  Reprint     04/11/20 0221           Note:  This document was prepared using Dragon voice recognition software and may include unintentional dictation errors.    Don Perking, [place], MD 04/11/20 [phone]

## 2020-04-10 NOTE — ED Triage Notes (Signed)
Patient with complaint of bilateral leg swelling that started yesterday.

## 2020-04-11 ENCOUNTER — Emergency Department: Payer: Medicare Other

## 2020-04-11 DIAGNOSIS — I82412 Acute embolism and thrombosis of left femoral vein: Secondary | ICD-10-CM | POA: Diagnosis not present

## 2020-04-11 LAB — TROPONIN I (HIGH SENSITIVITY): Troponin I (High Sensitivity): 15 ng/L (ref <18)

## 2020-04-11 MED ORDER — APIXABAN 2.5 MG PO TABS
2.5000 mg | ORAL_TABLET | Freq: Two times a day (BID) | ORAL | 1 refills | Status: DC
Start: 2020-04-11 — End: 2022-05-09

## 2020-04-11 NOTE — Discharge Instructions (Addendum)
Increase Lasix to 80 mg daily for the next 2 days.  Then resume your 40 mg daily.  Use compression stockings from the knee down.  Elevate the legs as much as possible.  As I discussed with you, your ultrasound is concerning for a blood clot.  The recommendation is to start blood thinners.  I am providing you with a prescription.  Make sure to discuss that with your primary care doctor.  Return to the emergency room for any chest pain or shortness of breath.

## 2020-04-11 NOTE — ED Notes (Signed)
Pt ambulated with walker. At rest pt was 100% on room air. Pt sat went down to 93% while walking to end of nursing station and returned to 100% when back in bed. Dr Don Perking notified.

## 2020-04-12 ENCOUNTER — Telehealth: Payer: Self-pay | Admitting: Family

## 2020-04-12 NOTE — Telephone Encounter (Signed)
LVM with patient to call us to schedule a new patient CHF Clinic appointment as well as to get her scheduled awith a cardiologist.   Joice Lofts, NT

## 2020-05-18 ENCOUNTER — Other Ambulatory Visit: Payer: Self-pay | Admitting: Obstetrics and Gynecology

## 2020-05-27 ENCOUNTER — Other Ambulatory Visit (HOSPITAL_COMMUNITY): Payer: Self-pay | Admitting: Obstetrics and Gynecology

## 2020-05-27 ENCOUNTER — Other Ambulatory Visit: Payer: Self-pay | Admitting: Obstetrics and Gynecology

## 2020-05-27 DIAGNOSIS — M79605 Pain in left leg: Secondary | ICD-10-CM

## 2020-07-06 ENCOUNTER — Encounter (INDEPENDENT_AMBULATORY_CARE_PROVIDER_SITE_OTHER): Payer: Self-pay

## 2020-07-06 ENCOUNTER — Other Ambulatory Visit: Payer: Self-pay

## 2020-07-06 ENCOUNTER — Ambulatory Visit
Admission: RE | Admit: 2020-07-06 | Discharge: 2020-07-06 | Disposition: A | Discharge status: Home / Self Care | Payer: Medicare Other | Source: Ambulatory Visit | Attending: Obstetrics and Gynecology | Admitting: Obstetrics and Gynecology

## 2020-07-06 DIAGNOSIS — M79605 Pain in left leg: Secondary | ICD-10-CM

## 2021-03-17 ENCOUNTER — Ambulatory Visit
Admission: RE | Admit: 2021-03-17 | Discharge: 2021-03-17 | Disposition: A | Discharge status: Home / Self Care | Payer: Medicare Other | Attending: Obstetrics and Gynecology | Admitting: Obstetrics and Gynecology

## 2021-03-17 ENCOUNTER — Other Ambulatory Visit: Payer: Self-pay | Admitting: Obstetrics and Gynecology

## 2021-03-17 ENCOUNTER — Inpatient Hospital Stay: Admit: 2021-03-17 | Payer: Medicare Other

## 2021-03-17 ENCOUNTER — Other Ambulatory Visit: Payer: Self-pay

## 2021-03-17 ENCOUNTER — Ambulatory Visit
Admission: RE | Admit: 2021-03-17 | Discharge: 2021-03-17 | Disposition: A | Discharge status: Home / Self Care | Payer: Medicare Other | Source: Ambulatory Visit | Attending: Obstetrics and Gynecology | Admitting: Obstetrics and Gynecology

## 2021-03-17 DIAGNOSIS — R062 Wheezing: Secondary | ICD-10-CM

## 2021-04-16 ENCOUNTER — Emergency Department
Admission: EM | Admit: 2021-04-16 | Discharge: 2021-04-16 | Disposition: A | Discharge status: Home / Self Care | Payer: Medicare Other | Attending: Emergency Medicine | Admitting: Emergency Medicine

## 2021-04-16 ENCOUNTER — Other Ambulatory Visit: Payer: Self-pay

## 2021-04-16 ENCOUNTER — Emergency Department: Payer: Medicare Other

## 2021-04-16 ENCOUNTER — Ambulatory Visit
Admission: EM | Admit: 2021-04-16 | Discharge: 2021-04-16 | Disposition: A | Discharge status: Home / Self Care | Payer: Medicare Other

## 2021-04-16 DIAGNOSIS — W06XXXA Fall from bed, initial encounter: Secondary | ICD-10-CM | POA: Diagnosis not present

## 2021-04-16 DIAGNOSIS — E119 Type 2 diabetes mellitus without complications: Secondary | ICD-10-CM | POA: Insufficient documentation

## 2021-04-16 DIAGNOSIS — Z7901 Long term (current) use of anticoagulants: Secondary | ICD-10-CM | POA: Diagnosis not present

## 2021-04-16 DIAGNOSIS — J449 Chronic obstructive pulmonary disease, unspecified: Secondary | ICD-10-CM | POA: Diagnosis not present

## 2021-04-16 DIAGNOSIS — S0181XA Laceration without foreign body of other part of head, initial encounter: Secondary | ICD-10-CM | POA: Insufficient documentation

## 2021-04-16 DIAGNOSIS — I110 Hypertensive heart disease with heart failure: Secondary | ICD-10-CM | POA: Diagnosis not present

## 2021-04-16 DIAGNOSIS — J45909 Unspecified asthma, uncomplicated: Secondary | ICD-10-CM | POA: Insufficient documentation

## 2021-04-16 DIAGNOSIS — S0990XA Unspecified injury of head, initial encounter: Secondary | ICD-10-CM

## 2021-04-16 DIAGNOSIS — F0390 Unspecified dementia without behavioral disturbance: Secondary | ICD-10-CM | POA: Insufficient documentation

## 2021-04-16 DIAGNOSIS — F17290 Nicotine dependence, other tobacco product, uncomplicated: Secondary | ICD-10-CM | POA: Diagnosis not present

## 2021-04-16 DIAGNOSIS — W19XXXA Unspecified fall, initial encounter: Secondary | ICD-10-CM

## 2021-04-16 DIAGNOSIS — I509 Heart failure, unspecified: Secondary | ICD-10-CM | POA: Insufficient documentation

## 2021-04-16 MED ORDER — LIDOCAINE-EPINEPHRINE-TETRACAINE (LET) TOPICAL GEL
3.0000 mL | Freq: Once | TOPICAL | Status: AC
  Administered 2021-04-16: 3 mL via TOPICAL
  Filled 2021-04-16: qty 3

## 2021-04-16 NOTE — ED Provider Notes (Signed)
[facility] Emergency Department Provider Note  ____________________________________________   Event Date/Time   First MD Initiated Contact with Patient 04/16/21 1420     (approximate)  I have reviewed the triage vital signs and the nursing notes.   HISTORY  Chief Complaint Fall    HPI Jodi Lewis is a 87 y.o. female presents emergency department with her daughter.  The daughter states that she fell out of bed earlier this morning hit her head.  She has a small lack to her forehead.  They do not know how [place] she had been sitting on the floor prior to being found.  She does live with her daughter.  Tdap is up-to-date.  Family states patient's been acting normal and has baseline dementia  Past Medical History:  Diagnosis Date   Arthritis    Asthma    Atrial fibrillation (HCC)    CHF (congestive heart failure) (HCC)    COPD (chronic obstructive pulmonary disease) (HCC)    Diabetes mellitus without complication (HCC)    GI bleed    Glaucoma    Hyperlipidemia    Hypertension    Hyperuricemia    Insomnia    Macular degeneration    MI, acute, non ST segment elevation (HCC)    Neuropathy    Osteoporosis    Xeroderma     There are no problems to display for this patient.   History reviewed. No pertinent surgical history.  Prior to Admission medications   Medication Sig Start Date End Date Taking? Authorizing Provider  apixaban (ELIQUIS) 2.5 MG TABS tablet Take 1 tablet (2.5 mg total) by mouth 2 (two) times daily. 04/11/20   Nita Sickle, MD    Allergies Patient has no known allergies.  History reviewed. No pertinent family history.  Social History Social History   Tobacco Use   Smoking status: Never   Smokeless tobacco: Current    Types: Snuff  Substance Use Topics   Alcohol use: Not Currently   Drug use: Never    Review of Systems  Constitutional: No fever/chills Eyes: No visual changes. ENT: No sore  throat. Respiratory: Denies cough Cardiovascular: Denies chest pain Gastrointestinal: Denies abdominal pain Genitourinary: Negative for dysuria. Musculoskeletal: Negative for back pain. Skin: Negative for rash. Psychiatric: no mood changes,     ____________________________________________   PHYSICAL EXAM:  VITAL SIGNS: ED Triage Vitals [04/16/21 1213]  Enc Vitals Group     BP 114/67     Pulse Rate 74     Resp 18     Temp 98.2 F (36.8 C)     Temp Source Oral     SpO2 96 %     Weight 135 lb (61.2 kg)     Height 5\' 4"  (1.626 m)     Head Circumference      Peak Flow      Pain Score      Pain Loc      Pain Edu?      Excl. in GC?     Constitutional: Alert  Well appearing and in no acute distress. Eyes: Conjunctivae are normal.  Head: 1.5 cm laceration noted to left side of forehead Nose: No congestion/rhinnorhea. Mouth/Throat: Mucous membranes are moist.   Neck:  supple no lymphadenopathy noted Cardiovascular: Normal rate, regular rhythm. Heart sounds are normal Respiratory: Normal respiratory effort.  No retractions, lungs c t a  Abd: soft nontender bs normal all 4 quad, no bruising noted GU: deferred Musculoskeletal: FROM all extremities, warm  and well perfused, patient is able to stand with help, nontender along any extremity Neurologic:  Normal speech and language.  Skin:  Skin is warm, dry  No rash noted. Psychiatric: Mood and affect are normal. Speech and behavior are normal.  ____________________________________________   LABS (all labs ordered are listed, but only abnormal results are displayed)  Labs Reviewed - No data to display ____________________________________________   ____________________________________________  RADIOLOGY  CT of the head  ____________________________________________   PROCEDURES  Procedure(s) performed:   Marland KitchenMarland KitchenLaceration Repair  Date/Time: 04/16/2021 3:29 PM Performed by: Faythe Ghee, PA-C Authorized by: Faythe Ghee, PA-C   Consent:    Consent obtained:  Verbal   Consent given by:  Patient   Risks discussed:  Infection, pain, poor cosmetic result and poor wound healing Universal protocol:    Procedure explained and questions answered to patient or proxy's satisfaction: yes     Patient identity confirmed:  Verbally with patient Anesthesia:    Anesthesia method:  Topical application   Topical anesthetic:  LET Laceration details:    Location:  Face   Face location:  Forehead   Length (cm):  1.5 Pre-procedure details:    Preparation:  Patient was prepped and draped in usual sterile fashion and imaging obtained to evaluate for foreign bodies Exploration:    Hemostasis achieved with:  LET   Imaging outcome: foreign body not noted     Wound exploration: wound explored through full range of motion     Wound extent: no foreign bodies/material noted, no muscle damage noted, no underlying fracture noted and no vascular damage noted     Contaminated: no   Treatment:    Area cleansed with:  Saline   Amount of cleaning:  Standard   Irrigation solution:  Sterile saline   Irrigation method:  Tap   Debridement:  None   Undermining:  None   Scar revision: no   Skin repair:    Repair method:  Tissue adhesive Approximation:    Approximation:  Close Repair type:    Repair type:  Simple Post-procedure details:    Dressing:  Open (no dressing)   Procedure completion:  Tolerated well, no immediate complications    ____________________________________________   INITIAL IMPRESSION / ASSESSMENT AND PLAN / ED COURSE  Pertinent labs & imaging results that were available during my care of the patient were reviewed by me and considered in my medical decision making (see chart for details).   Patient 87 year old female presents after a fall.  See HPI.  Physical exam shows patient per stable.  CT of the head reviewed by me and confirmed by radiology to be negative.  See procedure note for laceration  repair.  Patient does not appear to have any fractures.  She appears stable.  She is discharged in care of her daughter.     Jodi Lewis was evaluated in Emergency Department on 04/16/2021 for the symptoms described in the history of present illness. She was evaluated in the context of the global COVID-19 pandemic, which necessitated consideration that the patient might be at risk for infection with the SARS-CoV-2 virus that causes COVID-19. Institutional protocols and algorithms that pertain to the evaluation of patients at risk for COVID-19 are in a state of rapid change based on information released by regulatory bodies including the CDC and federal and state organizations. These policies and algorithms were followed during the patient's care in the ED.    As part of my medical decision making,  I reviewed the following data within the electronic MEDICAL RECORD NUMBER History obtained from family, Nursing notes reviewed and incorporated, Old chart reviewed, Radiograph reviewed , Notes from prior ED visits, and [place] Controlled Substance Database  ____________________________________________   FINAL CLINICAL IMPRESSION(S) / ED DIAGNOSES  Final diagnoses:  Fall, initial encounter  Facial laceration, initial encounter  Minor head injury, initial encounter      NEW MEDICATIONS STARTED DURING THIS VISIT:  New Prescriptions   No medications on file     Note:  This document was prepared using Dragon voice recognition software and may include unintentional dictation errors.    Faythe Ghee, PA-C 04/16/21 1531    Georga Hacking, MD 04/16/21 [phone]

## 2021-04-16 NOTE — ED Notes (Signed)
Pt presents with Laceration on L forehead; pt denies LOC

## 2021-04-16 NOTE — ED Triage Notes (Signed)
Pt with daughter who states that pt fell out of bed and hit her head- pt has a small lac to her forehead- pt states her left side is sore- pt has hx dementia

## 2021-04-16 NOTE — Discharge Instructions (Addendum)
Keep the area as dry as possible.  Do not cover the area or put Neosporin on it.  The area will begin to peel off on its own within 5 days.  Return if any signs of infection

## 2021-12-13 IMAGING — CR DG CHEST 2V
2 series · 2 of 2 positions shown · non-contrast
Comparison: 04/10/2020

CLINICAL DATA: Wheezing

EXAM:
CHEST - 2 VIEW

[chest lat]
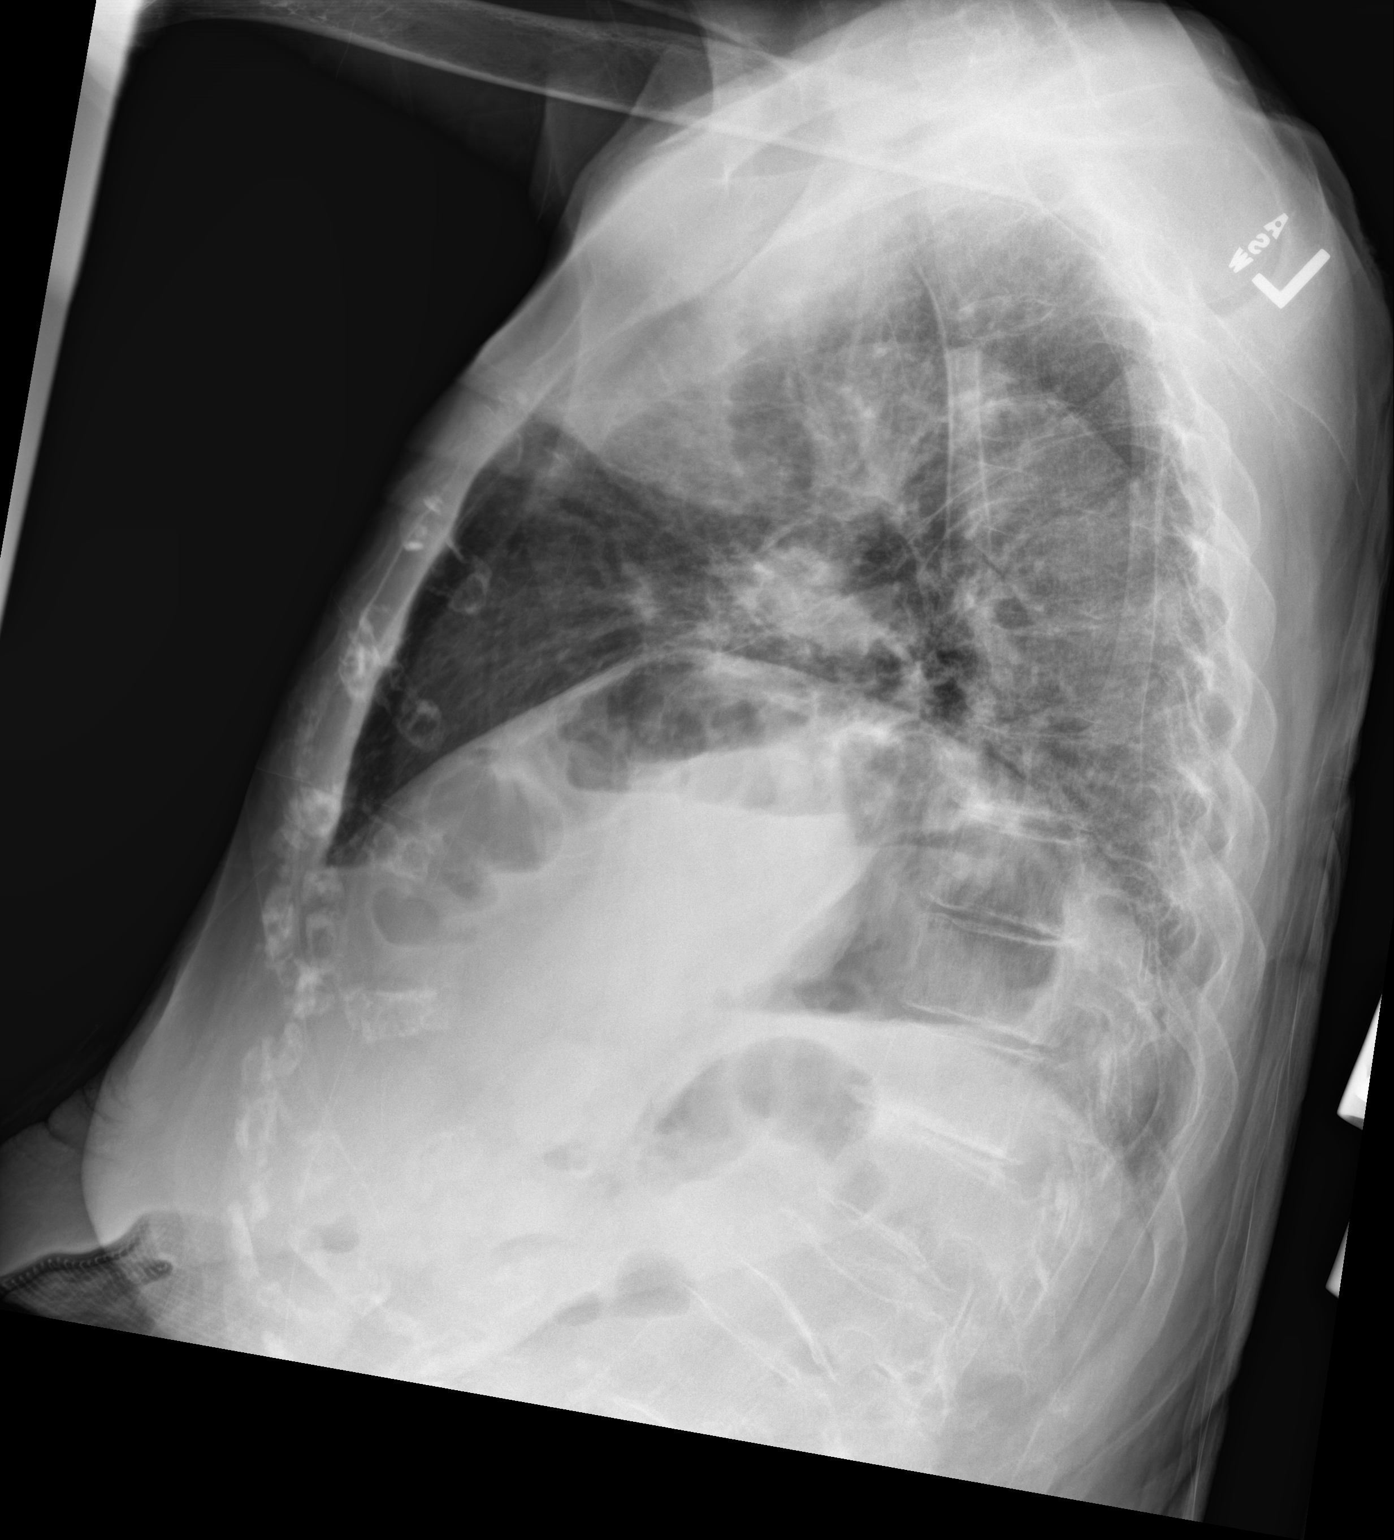

[chest ap]
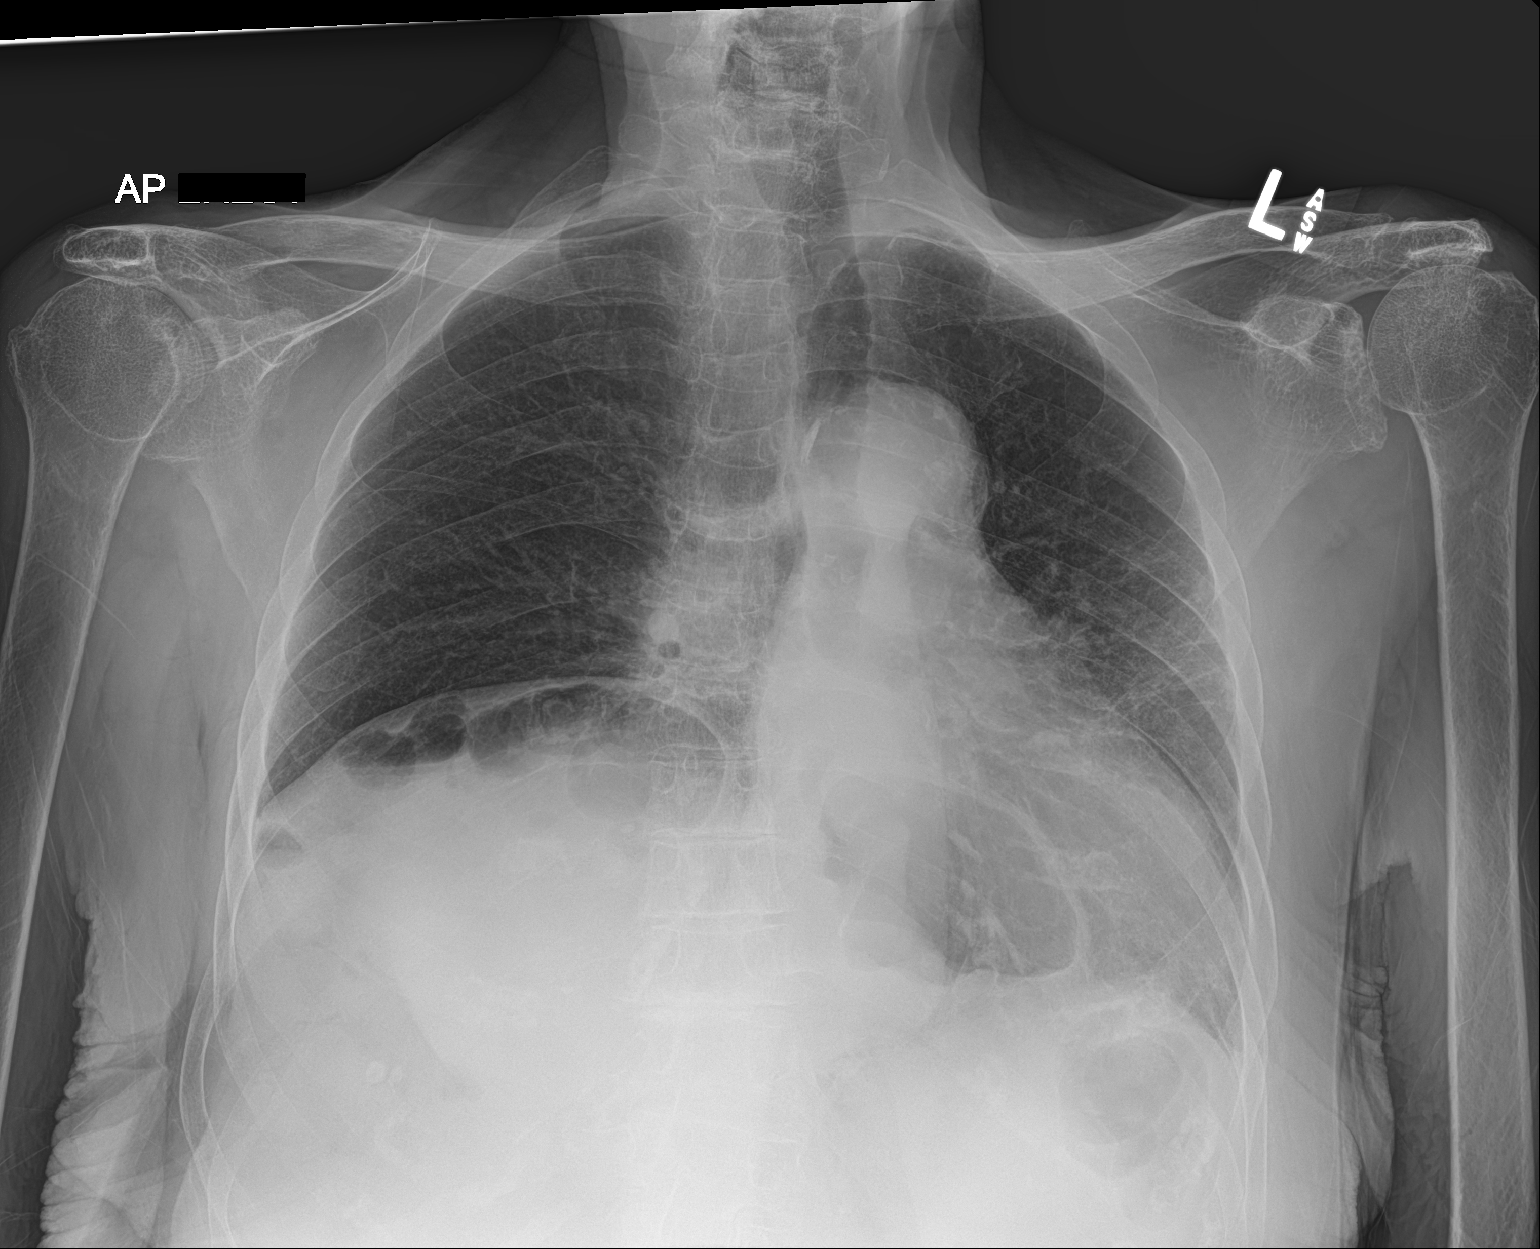

[2 of 2 positions shown; findings below may reference images not displayed]

FINDINGS: Normal heart size and pulmonary vascularity.

Atherosclerotic calcification aorta.

Large hiatal hernia.

Elevation of RIGHT diaphragm with bowel interposition.

Peribronchial thickening with chronic accentuation of markings in
the LEFT mid lung question scarring.

No acute infiltrate, pleural effusion, or pneumothorax.

Bones demineralized.
IMPRESSION: Hiatal hernia.

LEFT mid lung scarring.

No acute abnormalities.

## 2022-05-08 ENCOUNTER — Emergency Department: Payer: Medicare Other

## 2022-05-08 ENCOUNTER — Observation Stay
Admission: EM | Admit: 2022-05-08 | Discharge: 2022-05-09 | Disposition: A | Discharge status: Home / Self Care | Payer: Medicare Other | Attending: Internal Medicine | Admitting: Internal Medicine

## 2022-05-08 DIAGNOSIS — A419 Sepsis, unspecified organism: Secondary | ICD-10-CM

## 2022-05-08 DIAGNOSIS — J189 Pneumonia, unspecified organism: Principal | ICD-10-CM

## 2022-05-08 DIAGNOSIS — I509 Heart failure, unspecified: Secondary | ICD-10-CM | POA: Diagnosis not present

## 2022-05-08 DIAGNOSIS — Z20822 Contact with and (suspected) exposure to covid-19: Secondary | ICD-10-CM | POA: Diagnosis not present

## 2022-05-08 DIAGNOSIS — R0602 Shortness of breath: Secondary | ICD-10-CM | POA: Diagnosis present

## 2022-05-08 LAB — BLOOD GAS, VENOUS
Acid-base deficit: 1.1 mmol/L (ref 0.0–2.0)
Bicarbonate: 26.4 mmol/L (ref 20.0–28.0)
O2 Saturation: 67 %
Patient temperature: 37
pCO2, Ven: 55 mmHg (ref 44–60)
pH, Ven: 7.29 (ref 7.25–7.43)
pO2, Ven: 42 mmHg (ref 32–45)

## 2022-05-08 LAB — CBC WITH DIFFERENTIAL/PLATELET
Abs Immature Granulocytes: 0.03 10*3/uL (ref 0.00–0.07)
Basophils Absolute: 0 10*3/uL (ref 0.0–0.1)
Basophils Relative: 0 %
Eosinophils Absolute: 0.2 10*3/uL (ref 0.0–0.5)
Eosinophils Relative: 4 %
HCT: 36.2 % (ref 36.0–46.0)
Hemoglobin: 10.5 g/dL — ABNORMAL LOW (ref 12.0–15.0)
Immature Granulocytes: 1 %
Lymphocytes Relative: 15 %
Lymphs Abs: 1 10*3/uL (ref 0.7–4.0)
MCH: 26.7 pg (ref 26.0–34.0)
MCHC: 29 g/dL — ABNORMAL LOW (ref 30.0–36.0)
MCV: 92.1 fL (ref 80.0–100.0)
Monocytes Absolute: 0.6 10*3/uL (ref 0.1–1.0)
Monocytes Relative: 9 %
Neutro Abs: 4.7 10*3/uL (ref 1.7–7.7)
Neutrophils Relative %: 71 %
Platelets: 189 10*3/uL (ref 150–400)
RBC: 3.93 MIL/uL (ref 3.87–5.11)
RDW: 15.1 % (ref 11.5–15.5)
WBC: 6.6 10*3/uL (ref 4.0–10.5)
nRBC: 0 % (ref 0.0–0.2)

## 2022-05-08 LAB — COMPREHENSIVE METABOLIC PANEL
ALT: 13 U/L (ref 0–44)
AST: 34 U/L (ref 15–41)
Albumin: 3.9 g/dL (ref 3.5–5.0)
Alkaline Phosphatase: 70 U/L (ref 38–126)
Anion gap: 9 (ref 5–15)
BUN: 39 mg/dL — ABNORMAL HIGH (ref 8–23)
CO2: 24 mmol/L (ref 22–32)
Calcium: 9.3 mg/dL (ref 8.9–10.3)
Chloride: 106 mmol/L (ref 98–111)
Creatinine, Ser: 1.72 mg/dL — ABNORMAL HIGH (ref 0.44–1.00)
GFR, Estimated: 27 mL/min — ABNORMAL LOW (ref >60)
Glucose, Bld: 303 mg/dL — ABNORMAL HIGH (ref 70–99)
Potassium: 5.4 mmol/L — ABNORMAL HIGH (ref 3.5–5.1)
Sodium: 139 mmol/L (ref 135–145)
Total Bilirubin: 0.7 mg/dL (ref 0.3–1.2)
Total Protein: 6.8 g/dL (ref 6.5–8.1)

## 2022-05-08 LAB — SARS CORONAVIRUS 2 BY RT PCR: SARS Coronavirus 2 by RT PCR: NEGATIVE

## 2022-05-08 LAB — BRAIN NATRIURETIC PEPTIDE: B Natriuretic Peptide: 118.9 pg/mL — ABNORMAL HIGH (ref 0.0–100.0)

## 2022-05-08 LAB — TROPONIN I (HIGH SENSITIVITY): Troponin I (High Sensitivity): 11 ng/L (ref <18)

## 2022-05-08 LAB — LACTIC ACID, PLASMA: Lactic Acid, Venous: 3.4 mmol/L (ref 0.5–1.9)

## 2022-05-08 LAB — PROCALCITONIN: Procalcitonin: <0.1 ng/mL

## 2022-05-08 LAB — LIPASE, BLOOD: Lipase: 24 U/L (ref 11–51)

## 2022-05-08 MED ORDER — SODIUM CHLORIDE 0.9 % IV SOLN
500.0000 mg | INTRAVENOUS | Status: DC
  Administered 2022-05-09: 500 mg via INTRAVENOUS
  Filled 2022-05-08: qty 5

## 2022-05-08 MED ORDER — SODIUM CHLORIDE 0.9 % IV SOLN
2.0000 g | INTRAVENOUS | Status: DC
  Administered 2022-05-08: 2 g via INTRAVENOUS
  Filled 2022-05-08: qty 20

## 2022-05-08 MED ORDER — SODIUM CHLORIDE 0.9 % IV BOLUS
500.0000 mL | Freq: Once | INTRAVENOUS | Status: AC
  Administered 2022-05-08: 500 mL via INTRAVENOUS

## 2022-05-08 NOTE — Progress Notes (Signed)
CODE SEPSIS - PHARMACY COMMUNICATION  **Broad Spectrum Antibiotics should be administered within 1 hour of Sepsis diagnosis**  Time Code Sepsis Called/Page Received:  8/28 @ 2310   Antibiotics Ordered: Ceftriaxone 2 gm   Time of 1st antibiotic administration: 8/28 @ 2320   Additional action taken by pharmacy:   If necessary, Name of Provider/Nurse Contacted:     Madeleine Fenn D ,PharmD Clinical Pharmacist  05/08/2022  11:54 PM

## 2022-05-08 NOTE — ED Triage Notes (Addendum)
Pt's granddaughter says pt is breathing faster than normal.  Pt requiring 2lpm O2 and has Hx of CHF

## 2022-05-08 NOTE — ED Provider Notes (Signed)
[facility]  Provider Note    Event Date/Time   First MD Initiated Contact with Patient 05/08/22 2128     (approximate)   History   Shortness of Breath   HPI  Jodi Lewis is a 88 y.o. female who comes in with granddaughter due to increased breathing.  Patient has a history of CHF requiring 2 L at home.  They were concerned about patient's breathing.  They report the patient has chronic leg swelling that she is got no history of DVT or PE and that her left leg is chronically larger than the right.  They deny any known history of blood clots however I reviewed a note from July 2021 patient did have a left DVT noted.  It does appear that patient was previously on Eliquis.  The family do report that she was recently around somebody who has COVID and so they wanted her to be evaluated for COVID.  They deny any known falls.   Physical Exam   Triage Vital Signs: ED Triage Vitals  Enc Vitals Group     BP 05/08/22 2119 108/69     Pulse Rate 05/08/22 2119 82     Resp 05/08/22 2119 (!) 21     Temp 05/08/22 2119 (!) 95.7 F (35.4 C)     Temp Source 05/08/22 2119 Rectal     SpO2 05/08/22 2117 100 %     Weight --      Height --      Head Circumference --      Peak Flow --      Pain Score --      Pain Loc --      Pain Edu? --      Excl. in GC? --     Most recent vital signs: Vitals:   05/08/22 2117 05/08/22 2119  BP:  108/69  Pulse:  82  Resp:  (!) 21  Temp:  (!) 95.7 F (35.4 C)  SpO2: 100% 100%     General: Awake, no distress.  CV:  Good peripheral perfusion.  Resp:  Normal effort.  Abd:  No distention.  Other:  Increased respiratory rate without any obvious wheezing. Left leg is greater than right leg  ED Results / Procedures / Treatments   Labs (all labs ordered are listed, but only abnormal results are displayed) Labs Reviewed  CBC WITH DIFFERENTIAL/PLATELET - Abnormal; Notable for the following components:      Result Value    Hemoglobin 10.5 (*)    MCHC 29.0 (*)    All other components within normal limits  COMPREHENSIVE METABOLIC PANEL - Abnormal; Notable for the following components:   Potassium 5.4 (*)    Glucose, Bld 303 (*)    BUN 39 (*)    Creatinine, Ser 1.72 (*)    GFR, Estimated 27 (*)    All other components within normal limits  BRAIN NATRIURETIC PEPTIDE - Abnormal; Notable for the following components:   B Natriuretic Peptide 118.9 (*)    All other components within normal limits  LACTIC ACID, PLASMA - Abnormal; Notable for the following components:   Lactic Acid, Venous 3.4 (*)    All other components within normal limits  SARS CORONAVIRUS 2 BY RT PCR  CULTURE, BLOOD (ROUTINE X 2)  CULTURE, BLOOD (ROUTINE X 2)  LIPASE, BLOOD  PROCALCITONIN  BLOOD GAS, VENOUS  URINALYSIS, ROUTINE W REFLEX MICROSCOPIC  LACTIC ACID, PLASMA  PROCALCITONIN  POTASSIUM  TROPONIN I (HIGH SENSITIVITY)  TROPONIN I (HIGH SENSITIVITY)     EKG  My interpretation of EKG:    RADIOLOGY I have reviewed the xray personally and interpreted and there is concern for some pneumonia recommend a repeat x-ray in 4 to 6 weeks to reevaluate  PROCEDURES:  Critical Care performed: Yes, see critical care procedure note(s)  .1-3 Lead EKG Interpretation  Performed by: Concha Se, MD Authorized by: Concha Se, MD     Interpretation: normal     ECG rate:  70   ECG rate assessment: normal     Rhythm: sinus rhythm     Ectopy: none     Conduction: normal   .Critical Care  Performed by: Concha Se, MD Authorized by: Concha Se, MD   Critical care provider statement:    Critical care time (minutes):  30   Critical care was necessary to treat or prevent imminent or life-threatening deterioration of the following conditions:  Sepsis   Critical care was time spent personally by me on the following activities:  Development of treatment plan with patient or surrogate, discussions with consultants, evaluation of  patient's response to treatment, examination of patient, ordering and review of laboratory studies, ordering and review of radiographic studies, ordering and performing treatments and interventions, pulse oximetry, re-evaluation of patient's condition and review of old charts    MEDICATIONS ORDERED IN ED: Medications  cefTRIAXone (ROCEPHIN) 2 g in sodium chloride 0.9 % 100 mL IVPB (2 g Intravenous New Bag/Given 05/08/22 2320)  azithromycin (ZITHROMAX) 500 mg in sodium chloride 0.9 % 250 mL IVPB (has no administration in time range)  sodium chloride 0.9 % bolus 500 mL (500 mLs Intravenous New Bag/Given 05/08/22 2327)     IMPRESSION / MDM / ASSESSMENT AND PLAN / ED COURSE  I reviewed the triage vital signs and the nursing notes.   Patient's presentation is most consistent with acute presentation with potential threat to life or bodily function.   Patient comes in with increased work of breathing with known COVID contact.  X-ray concerning for pneumonia.  I considered the possibility of PE given prior history of DVT but unable to get CT PE due to patient's GFR.  Troponin however without evidence of ACS.  We will start off with ultrasounds of the legs to evaluate for DVT.  Will cover with antibiotics for pneumonia.  I did talk to family about following up outpatient for repeat x-ray once better  VBG normal COVID-negative CBC potassium was 5.4 but hemolyzed lipase normal   The patient is on the cardiac monitor to evaluate for evidence of arrhythmia and/or significant heart rate changes.      FINAL CLINICAL IMPRESSION(S) / ED DIAGNOSES   Final diagnoses:  Community acquired pneumonia, unspecified laterality  Sepsis, due to unspecified organism, unspecified whether acute organ dysfunction present [facility])     Rx / DC Orders   ED Discharge Orders     None        Note:  This document was prepared using Dragon voice recognition software and may include unintentional dictation errors.    Concha Se, MD 05/09/22 Jacinta Shoe

## 2022-05-09 ENCOUNTER — Inpatient Hospital Stay: Payer: Medicare Other

## 2022-05-09 ENCOUNTER — Other Ambulatory Visit: Payer: Self-pay

## 2022-05-09 DIAGNOSIS — J189 Pneumonia, unspecified organism: Secondary | ICD-10-CM | POA: Diagnosis not present

## 2022-05-09 LAB — URINALYSIS, ROUTINE W REFLEX MICROSCOPIC
Bilirubin Urine: NEGATIVE
Glucose, UA: 50 mg/dL — AB
Hgb urine dipstick: NEGATIVE
Ketones, ur: NEGATIVE mg/dL
Leukocytes,Ua: NEGATIVE
Nitrite: NEGATIVE
Protein, ur: NEGATIVE mg/dL
Specific Gravity, Urine: 1.017 (ref 1.005–1.030)
pH: 5 (ref 5.0–8.0)

## 2022-05-09 LAB — RESPIRATORY PANEL BY PCR

## 2022-05-09 LAB — POTASSIUM: Potassium: 5 mmol/L (ref 3.5–5.1)

## 2022-05-09 LAB — PROCALCITONIN: Procalcitonin: 0.41 ng/mL

## 2022-05-09 LAB — CBC
HCT: 32.2 % — ABNORMAL LOW (ref 36.0–46.0)
Hemoglobin: 9.7 g/dL — ABNORMAL LOW (ref 12.0–15.0)
MCH: 27.2 pg (ref 26.0–34.0)
MCHC: 30.1 g/dL (ref 30.0–36.0)
MCV: 90.2 fL (ref 80.0–100.0)
Platelets: 163 10*3/uL (ref 150–400)
RBC: 3.57 MIL/uL — ABNORMAL LOW (ref 3.87–5.11)
RDW: 14.8 % (ref 11.5–15.5)
WBC: 8.2 10*3/uL (ref 4.0–10.5)
nRBC: 0 % (ref 0.0–0.2)

## 2022-05-09 LAB — CREATININE, SERUM
Creatinine, Ser: 1.37 mg/dL — ABNORMAL HIGH (ref 0.44–1.00)
GFR, Estimated: 35 mL/min — ABNORMAL LOW (ref >60)

## 2022-05-09 LAB — CBG MONITORING, ED: Glucose-Capillary: 86 mg/dL (ref 70–99)

## 2022-05-09 LAB — STREP PNEUMONIAE URINARY ANTIGEN: Strep Pneumo Urinary Antigen: NEGATIVE

## 2022-05-09 LAB — HIV ANTIBODY (ROUTINE TESTING W REFLEX): HIV Screen 4th Generation wRfx: NONREACTIVE

## 2022-05-09 LAB — LACTIC ACID, PLASMA: Lactic Acid, Venous: 2.1 mmol/L (ref 0.5–1.9)

## 2022-05-09 LAB — TROPONIN I (HIGH SENSITIVITY): Troponin I (High Sensitivity): 13 ng/L (ref <18)

## 2022-05-09 MED ORDER — AZITHROMYCIN 250 MG PO TABS: 250.0000 mg | ORAL_TABLET | Freq: Every day | ORAL | 0 refills | Status: AC

## 2022-05-09 MED ORDER — SODIUM CHLORIDE 0.9 % IV SOLN: INTRAVENOUS | Status: AC

## 2022-05-09 MED ORDER — FLUTICASONE PROPIONATE HFA 44 MCG/ACT IN AERO
2.0000 | INHALATION_SPRAY | Freq: Two times a day (BID) | RESPIRATORY_TRACT | Status: DC
Start: 2022-05-09 — End: 2022-05-09

## 2022-05-09 MED ORDER — LISINOPRIL 5 MG PO TABS
5.0000 mg | ORAL_TABLET | Freq: Every day | ORAL | Status: DC
  Administered 2022-05-09: 5 mg via ORAL
  Filled 2022-05-09: qty 1

## 2022-05-09 MED ORDER — SODIUM CHLORIDE 0.9 % IV SOLN: 500.0000 mg | INTRAVENOUS | Status: DC

## 2022-05-09 MED ORDER — BUDESONIDE 0.25 MG/2ML IN SUSP
0.2500 mg | Freq: Two times a day (BID) | RESPIRATORY_TRACT | Status: DC
  Administered 2022-05-09: 0.25 mg via RESPIRATORY_TRACT
  Filled 2022-05-09: qty 2

## 2022-05-09 MED ORDER — AMOXICILLIN-POT CLAVULANATE 500-125 MG PO TABS: 1.0000 | ORAL_TABLET | Freq: Two times a day (BID) | ORAL | 0 refills | Status: AC

## 2022-05-09 MED ORDER — HEPARIN SODIUM (PORCINE) 5000 UNIT/ML IJ SOLN
5000.0000 [IU] | Freq: Three times a day (TID) | INTRAMUSCULAR | Status: DC
  Administered 2022-05-09: 5000 [IU] via SUBCUTANEOUS
  Filled 2022-05-09: qty 1

## 2022-05-09 MED ORDER — FUROSEMIDE 40 MG PO TABS
40.0000 mg | ORAL_TABLET | Freq: Every day | ORAL | Status: DC
  Administered 2022-05-09: 40 mg via ORAL
  Filled 2022-05-09: qty 1

## 2022-05-09 MED ORDER — ALPRAZOLAM 0.5 MG PO TABS
0.2500 mg | ORAL_TABLET | Freq: Two times a day (BID) | ORAL | Status: DC | PRN
  Administered 2022-05-09: 0.25 mg via ORAL
  Filled 2022-05-09: qty 1

## 2022-05-09 MED ORDER — PANTOPRAZOLE SODIUM 40 MG PO TBEC
40.0000 mg | DELAYED_RELEASE_TABLET | Freq: Every day | ORAL | Status: DC
  Administered 2022-05-09: 40 mg via ORAL
  Filled 2022-05-09: qty 1

## 2022-05-09 MED ORDER — SODIUM CHLORIDE 0.9 % IV SOLN: 2.0000 g | INTRAVENOUS | Status: DC

## 2022-05-09 NOTE — Sepsis Progress Note (Signed)
Following per sepsis protocol   

## 2022-05-09 NOTE — Care Management CC44 (Signed)
Condition Code 44 Documentation Completed  Patient Details  Name: Jodi Lewis MRN: [id number] Date of Birth: 1922-12-11   Condition Code 44 given:  Yes Patient signature on Condition Code 44 notice:  Yes Documentation of 2 MD's agreement:  Yes Code 44 added to claim:  Yes    Allayne Butcher, RN 05/09/2022, 2:22 PM

## 2022-05-09 NOTE — H&P (Signed)
History and Physical    Jodi Lewis ZDG:[id number] DOB: Jan 10, 1923 DOA: 05/08/2022  PCP: Ethelda Chick, MD  Patient coming from: HOME  I have personally briefly reviewed patient's old medical records in [facility]  Chief Complaint: SOB X 1 DAY  HPI: [name] is a 88 y.o. female with medical history significant of Afib, Asthma/COPD, CHF,CKDIIIb, DMII, HLD, HTN, Gout,CAD s/p MI / hx of DVT, chronic lymphedema, who presents to ed with once day of acute sob with associated n/v.  Patient notes no chest pain , abdominal pain , dysuria. Daughter who give history notes patient was in normal state of health yesterday.    ED Course:  Vitals: 95.7, bp 108/69, hr 82, rr 21  sat 100% on ra  Labs Wbc 6.6, hgb 10.5,plt189 NA:139, K 5.4, cl106, glu 303, cr 1.72 (around baseline) Lactic 3.4,2;1 Bnp 118.9 Procal <0.1 Covid:neg Vbg: 7.29/55 UA: neg Cxr: IMPRESSION: Increasing patchy strandy and airspace opacities in the left mid lung worrisome for infection. U/s : Neg dvt Tx ctx /zithromax ns 500 cc Review of Systems: As per HPI otherwise 10 point review of systems negative.   Past Medical History:  Diagnosis Date   Arthritis    Asthma    Atrial fibrillation (HCC)    CHF (congestive heart failure) (HCC)    COPD (chronic obstructive pulmonary disease) (HCC)    Diabetes mellitus without complication (HCC)    GI bleed    Glaucoma    Hyperlipidemia    Hypertension    Hyperuricemia    Insomnia    Macular degeneration    MI, acute, non ST segment elevation (HCC)    Neuropathy    Osteoporosis    Xeroderma     No past surgical history on file.   reports that she has never smoked. Her smokeless tobacco use includes snuff. She reports that she does not currently use alcohol. She reports that she does not use drugs.  Allergies  Allergen Reactions   Aspirin Other (See Comments)    GI    No family history on file.  Prior to Admission medications    Medication Sig Start Date End Date Taking? Authorizing Provider  brimonidine (ALPHAGAN) 0.2 % ophthalmic solution 1 drop 2 (two) times daily. 04/21/22  Yes [provider]  calcium carbonate (OSCAL) 1500 (600 Ca) MG TABS tablet Take 1,500 mg by mouth daily with breakfast.   Yes [provider]  FLOVENT HFA 44 MCG/ACT inhaler Inhale 2 puffs into the lungs 2 (two) times daily. 02/27/22  Yes [provider]  furosemide (LASIX) 40 MG tablet Take 40 mg by mouth daily.   Yes [provider]  latanoprost (XALATAN) 0.005 % ophthalmic solution Place 1 drop into both eyes at bedtime. 04/14/22  Yes [provider]  lisinopril (ZESTRIL) 5 MG tablet Take 5 mg by mouth daily. 02/13/22  Yes [provider]  Multiple Vitamins-Minerals (PRESERVISION AREDS 2+MULTI VIT PO) Take 1 tablet by mouth daily.   Yes [provider]  omeprazole (PRILOSEC) 20 MG capsule Take 20 mg by mouth daily. 03/22/22  Yes [provider]  potassium chloride SA (KLOR-CON M) 20 MEQ tablet Take 20 mEq by mouth daily. 03/22/22  Yes [provider]  apixaban (ELIQUIS) 2.5 MG TABS tablet Take 1 tablet (2.5 mg total) by mouth 2 (two) times daily. Patient not taking: Reported on 05/08/2022 04/11/20   Nita Sickle, MD    Physical Exam: Vitals:   05/08/22 2311 05/08/22 2313  05/09/22 0000 05/09/22 0041  BP:   124/77 121/74  Pulse: 71 72 75 68  Resp: (!) 24 (!) 23 20 (!) 24  Temp:    97.9 F (36.6 C)  TempSrc:    Oral  SpO2: 100% 100% 100% 100%     Vitals:   05/08/22 2311 05/08/22 2313 05/09/22 0000 05/09/22 0041  BP:   124/77 121/74  Pulse: 71 72 75 68  Resp: (!) 24 (!) 23 20 (!) 24  Temp:    97.9 F (36.6 C)  TempSrc:    Oral  SpO2: 100% 100% 100% 100%  Constitutional: NAD, calm, comfortable Eyes: PERRL, lids and conjunctivae normal ENMT: Mucous membranes are moist. Posterior pharynx clear of any exudate or lesions.Normal dentition.  Neck: normal,  supple, no masses, no thyromegaly Respiratory: clear to auscultation bilaterally, no wheezing, no crackles. Normal respiratory effort. No accessory muscle use.  Cardiovascular: Regular rate and rhythm, no murmurs / rubs / gallops. No extremity edema. 2+ pedal pulses.  Abdomen: no tenderness, no masses palpated. No hepatosplenomegaly. Bowel sounds positive.  Musculoskeletal: no clubbing / cyanosis. No joint deformity upper and lower extremities. Good ROM, no contractures. Normal muscle tone.  Skin: no rashes, lesions, ulcers. No induration Neurologic: CN 2-12 grossly intact. Sensation intact, MAE x4  Psychiatric: alert to self only   Labs on Admission: I have personally reviewed following labs and imaging studies  CBC: Recent Labs  Lab 05/08/22 2141  WBC 6.6  NEUTROABS 4.7  HGB 10.5*  HCT 36.2  MCV 92.1  PLT 189   Basic Metabolic Panel: Recent Labs  Lab 05/08/22 2141 05/09/22 0056  NA 139  --   K 5.4* 5.0  CL 106  --   CO2 24  --   GLUCOSE 303*  --   BUN 39*  --   CREATININE 1.72*  --   CALCIUM 9.3  --    GFR: CrCl cannot be calculated (Unknown ideal weight.). Liver Function Tests: Recent Labs  Lab 05/08/22 2141  AST 34  ALT 13  ALKPHOS 70  BILITOT 0.7  PROT 6.8  ALBUMIN 3.9   Recent Labs  Lab 05/08/22 2141  LIPASE 24   No results for input(s): "AMMONIA" in the last 168 hours. Coagulation Profile: No results for input(s): "INR", "PROTIME" in the last 168 hours. Cardiac Enzymes: No results for input(s): "CKTOTAL", "CKMB", "CKMBINDEX", "TROPONINI" in the last 168 hours. BNP (last 3 results) No results for input(s): "PROBNP" in the last 8760 hours. HbA1C: No results for input(s): "HGBA1C" in the last 72 hours. CBG: No results for input(s): "GLUCAP" in the last 168 hours. Lipid Profile: No results for input(s): "CHOL", "HDL", "LDLCALC", "TRIG", "CHOLHDL", "LDLDIRECT" in the last 72 hours. Thyroid Function Tests: No results for input(s): "TSH", "T4TOTAL",  "FREET4", "T3FREE", "THYROIDAB" in the last 72 hours. Anemia Panel: No results for input(s): "VITAMINB12", "FOLATE", "FERRITIN", "TIBC", "IRON", "RETICCTPCT" in the last 72 hours. Urine analysis:    Component Value Date/Time   COLORURINE Yellow 09/28/2014 0200   APPEARANCEUR Clear 09/28/2014 0200   LABSPEC 1.017 09/28/2014 0200   PHURINE 5.0 09/28/2014 0200   GLUCOSEU Negative 09/28/2014 0200   HGBUR Negative 09/28/2014 0200   BILIRUBINUR Negative 09/28/2014 0200   KETONESUR Negative 09/28/2014 0200   PROTEINUR Negative 09/28/2014 0200   NITRITE Negative 09/28/2014 0200   LEUKOCYTESUR Negative 09/28/2014 0200    Radiological Exams on Admission: US Venous Img Lower Bilateral  Result Date: 05/09/2022 CLINICAL DATA:  Bilateral leg swelling EXAM: BILATERAL LOWER  EXTREMITY VENOUS DOPPLER ULTRASOUND TECHNIQUE: Gray-scale sonography with compression, as well as color and duplex ultrasound, were performed to evaluate the deep venous system(s) from the level of the common femoral vein through the popliteal and proximal calf veins. COMPARISON:  None Available. FINDINGS: VENOUS Normal compressibility of the common femoral, superficial femoral, and popliteal veins, as well as the visualized calf veins. Visualized portions of profunda femoral vein and great saphenous vein unremarkable. No filling defects to suggest DVT on grayscale or color Doppler imaging. Doppler waveforms show normal direction of venous flow, normal respiratory plasticity and response to augmentation. OTHER Subcutaneous edema in the calves bilaterally, left greater than right. Limitations: none IMPRESSION: Negative. Electronically Signed   By: Charline Bills M.D.   On: 05/09/2022 00:44   DG Chest Port 1 View  Result Date: 05/08/2022 CLINICAL DATA:  Breathing faster EXAM: PORTABLE CHEST 1 VIEW COMPARISON:  Chest x-ray 03/17/2021 FINDINGS: There is stable mild elevation of the right hemidiaphragm. There is some patchy and strandy  opacities in the left mid lung, increased from prior. Right lung is clear. No pleural effusion or pneumothorax. Patient is rotated. The cardiomediastinal silhouette is stable, the heart is mildly enlarged. No acute fractures are seen. IMPRESSION: Increasing patchy strandy and airspace opacities in the left mid lung worrisome for infection. Follow-up chest x-ray recommended in 4-6 weeks to re-evaluate. Electronically Signed   By: Darliss Cheney M.D.   On: 05/08/2022 22:59    EKG: Independently reviewed.   Assessment/Plan  CAP  w/o hypoxemia -admit med tele  -continue on ctx/azithromycin -de-escalate abx as able  -f/u on culture data/ urine ag -pulmonary toilet  Lactic acidosis -improving with ivfs -due infection -continue to monitor  -no fever/no increase /wbc  does not meet criteria for sepsis -continue to monitor patient status   Hx of Afib  -current sinus  -not on AC/ or rate control med   CHF unspecified  -appears compensated  -continue lasix   Asthma/COPD -followed by pulmonary -continue nebs prn   CKDIIIb -at baseline   DMII -diet controlled  -monitor fs   HTN  -stable resume home regimen   CAD s/p MI -no active issues -not on therapy currently  Hx of DVT -s/p tx  -lower ext u/s this admit neg  Chronic lymphedema -on lasix  -at baseline   DVT prophylaxis: heparin Code Status: DNR Family Communication: DTR at bedside Disposition Plan: patient  expected to be admitted greater than 2 midnights  Consults called: n/a Admission status: med tele   Lurline Del MD [facility]   If 7PM-7AM, please contact night-coverage [email or web address] Password [email or web address]  05/09/2022, 2:05 AM

## 2022-05-09 NOTE — Discharge Summary (Signed)
Physician Discharge Summary   Patient: Jodi Lewis MRN: [id number]  DOB: [birth year]   Admit:     Date of Admission: 05/08/2022 Admitted from: home   Discharge: Date of discharge: 05/09/22 Disposition: Home Condition at discharge: fair / baseline  CODE STATUS: DNR     Discharge Physician: Sunnie Nielsen, DO [facility]     PCP: Ethelda Chick, MD  Recommendations for Outpatient Follow-up:  Follow up with PCP Ethelda Chick, MD in 1-2 weeks Please obtain labs/tests: Follow-up chest x-ray recommended in 4-6 weeks to re-evaluate  Please follow up on the following pending results: blood cultures (family opted to take patient home rather than keep overnight to await these results, given patient's relative stability this was felt to be safe as [place] as appropriate outpatient f/u) PCP AND OTHER OUTPATIENT PROVIDERS: SEE BELOW FOR SPECIFIC DISCHARGE INSTRUCTIONS PRINTED FOR PATIENT IN ADDITION TO GENERIC AVS PATIENT INFO  Discharge Instructions     Diet - low sodium heart healthy   Complete by: As directed    Discharge instructions   Complete by: As directed    Finish antibiotics as directed If worsening trouble breathing, please seek medical care ASAP OK to take OTC cough medicine: guaifenesin with or without dextromethorphan   Increase activity slowly   Complete by: As directed           Hospital Course: Jodi Lewis Jodi is a 88 y.o. female with medical history significant of Afib, Asthma/COPD, CHF,CKDIIIb, DMII, HLD, HTN, Gout,CAD s/p MI / hx of DVT, chronic lymphedema, who presents to ED 05/08/2022 with one day of acute sob with associated n/v.  Patient notes no chest pain , abdominal pain , dysuria. Daughter who give history notes patient was in normal state of health yesterday. Vitals: 95.7, bp 108/69, hr 82, rr 21  sat 100% on ra. Labs Wbc 6.6, hgb 10.5,plt189 NA:139, K 5.4, cl106, glu 303,  r 1.72 (around baseline) Lactic 3.4,2;1 Bnp 118.9  Procal <0.1 Covid:neg Vbg: 7.29/55 UA: neg Cxr: IMPRESSION: Increasing patchy strandy and  airspace opacities in the left mid lung worrisome for infection. U/s : Neg dvt Tx ctx /zithromax ns 500 cc.  Following morning, 05/09/2022, patient was off oxygen, minimal cough, granddaughter at bedside is requesting discharge home and patient, while demented, seems agreeable.  Denies shortness of breath or pain.    Consultants:  none  Procedures:  none      Discharge Diagnoses: Principal Problem:   CAP (community acquired pneumonia)    Assessment & Plan:  CAP  w/o hypoxemia Augmentin-azithro on discharge F/u cultures - see above granddaughter declined to wait on these results and given clinical picture I think this is reasonable I do not suspect bacteremia    Lactic acidosis -improving with ivfs -due infection -no fever/no increase /wbc  does not meet criteria for sepsis   Hx of Afib  -current sinus  -not on AC/ or rate control med    CHF unspecified no echo on file  -appears compensated  -continue lasix    Asthma/COPD -followed by pulmonary -continue nebs prn    CKDIIIb -at baseline    DMII -diet controlled  -monitor fs    HTN  -stable resume home regimen    CAD s/p MI -no active issues -not on therapy currently   Hx of DVT -s/p tx  -lower ext u/s this admit neg no DVT   Chronic lymphedema -on lasix  -at baseline        Discharge  Instructions  Allergies as of 05/09/2022       Reactions   Aspirin Other (See Comments)   GI        Medication List     STOP taking these medications    apixaban 2.5 MG Tabs tablet Commonly known as: ELIQUIS       TAKE these medications    amoxicillin-clavulanate 500-125 MG tablet Commonly known as: AUGMENTIN Take 1 tablet (500 mg total) by mouth 2 (two) times daily for 8 doses. First dose to start evening of 05/09/2022   azithromycin 250 MG tablet Commonly known as: Zithromax Take 1 tablet (250 mg  total) by mouth daily for 4 days. First dose to start morning of 05/10/2022   brimonidine 0.2 % ophthalmic solution Commonly known as: ALPHAGAN 1 drop 2 (two) times daily.   calcium carbonate 1500 (600 Ca) MG Tabs tablet Commonly known as: OSCAL Take 1,500 mg by mouth daily with breakfast.   Flovent HFA 44 MCG/ACT inhaler Generic drug: fluticasone Inhale 2 puffs into the lungs 2 (two) times daily.   furosemide 40 MG tablet Commonly known as: LASIX Take 40 mg by mouth daily.   latanoprost 0.005 % ophthalmic solution Commonly known as: XALATAN Place 1 drop into both eyes at bedtime.   lisinopril 5 MG tablet Commonly known as: ZESTRIL Take 5 mg by mouth daily.   omeprazole 20 MG capsule Commonly known as: PRILOSEC Take 20 mg by mouth daily.   potassium chloride SA 20 MEQ tablet Commonly known as: KLOR-CON M Take 20 mEq by mouth daily.   PRESERVISION AREDS 2+MULTI VIT PO Take 1 tablet by mouth daily.          Allergies  Allergen Reactions   Aspirin Other (See Comments)    GI     Subjective: Patient is feeling well today, on exam this morning she states no trouble breathing, minimal cough.  She would like to go home if possible.  Granddaughter at bedside, agreed to reexamine patient in the afternoon and if still stable off of oxygen and no other concerns will be okay for discharge.  Patient doing well this afternoon 2.   Discharge Exam: BP 125/80   Pulse 72   Temp 98 F (36.7 C) (Oral)   Resp (!) 23   SpO2 97%  General: Pt is alert, awake, not in acute distress Cardiovascular: RRR, S1/S2 +, no rubs, no gallops Respiratory: CTA bilaterally, no wheezing, no rhonchi Abdominal: Soft, NT, ND, bowel sounds + Extremities: no edema, no cyanosis     The results of significant diagnostics from this hospitalization (including imaging, microbiology, ancillary and laboratory) are listed below for reference.     Microbiology: Recent Results (from the past 240  hour(s))  SARS Coronavirus 2 by RT PCR (hospital order, performed in [facility] hospital lab) *cepheid single result test* Anterior Nasal Swab     Status: None   Collection Time: 05/08/22  9:41 PM   Specimen: Anterior Nasal Swab  Result Value Ref Range Status   SARS Coronavirus 2 by RT PCR NEGATIVE NEGATIVE Final    Comment: (NOTE) SARS-CoV-2 target nucleic acids are NOT DETECTED.  The SARS-CoV-2 RNA is generally detectable in upper and lower respiratory specimens during the acute phase of infection. The lowest concentration of SARS-CoV-2 viral copies this assay can detect is 250 copies / mL. A negative result does not preclude SARS-CoV-2 infection and should not be used as the sole basis for treatment or other patient management decisions.  A  negative result may occur with improper specimen collection / handling, submission of specimen other than nasopharyngeal swab, presence of viral mutation(s) within the areas targeted by this assay, and inadequate number of viral copies (<250 copies / mL). A negative result must be combined with clinical observations, patient history, and epidemiological information.  Fact Sheet for Patients:   RoadLapTop.co.za  Fact Sheet for Healthcare Providers: http://kim-miller.com/  This test is not yet approved or  cleared by the [place] FDA and has been authorized for detection and/or diagnosis of SARS-CoV-2 by FDA under an Emergency Use Authorization (EUA).  This EUA will remain in effect (meaning this test can be used) for the duration of the COVID-19 declaration under Section 564(b)(1) of the Act, 21 U.S.C. section 360bbb-3(b)(1), unless the authorization is terminated or revoked sooner.  Performed at [facility], [address] [place], [place] [address]   Blood culture (routine x 2)     Status: None (Preliminary result)   Collection Time: 05/08/22  9:41 PM   Specimen: BLOOD   Result Value Ref Range Status   Specimen Description BLOOD RIGTH WRIST  Final   Special Requests   Final    BOTTLES DRAWN AEROBIC AND ANAEROBIC Blood Culture results may not be optimal due to an inadequate volume of blood received in culture bottles   Culture   Final    NO GROWTH < 12 HOURS Performed at [facility], [address], [place], [place] [address]    Report Status PENDING  Incomplete  Blood culture (routine x 2)     Status: None (Preliminary result)   Collection Time: 05/08/22  9:41 PM   Specimen: BLOOD  Result Value Ref Range Status   Specimen Description BLOOD LEFT AC  Final   Special Requests   Final    BOTTLES DRAWN AEROBIC AND ANAEROBIC Blood Culture results may not be optimal due to an inadequate volume of blood received in culture bottles   Culture   Final    NO GROWTH < 12 HOURS Performed at [facility], [address], [place], [place] [address]    Report Status PENDING  Incomplete     Labs: BNP (last 3 results) Recent Labs    05/08/22 2141  BNP 118.9*   Basic Metabolic Panel: Recent Labs  Lab 05/08/22 2141 05/09/22 0056 05/09/22 0631  NA 139  --   --   K 5.4* 5.0  --   CL 106  --   --   CO2 24  --   --   GLUCOSE 303*  --   --   BUN 39*  --   --   CREATININE 1.72*  --  1.37*  CALCIUM 9.3  --   --    Liver Function Tests: Recent Labs  Lab 05/08/22 2141  AST 34  ALT 13  ALKPHOS 70  BILITOT 0.7  PROT 6.8  ALBUMIN 3.9   Recent Labs  Lab 05/08/22 2141  LIPASE 24   No results for input(s): "AMMONIA" in the last 168 hours. CBC: Recent Labs  Lab 05/08/22 2141 05/09/22 0631  WBC 6.6 8.2  NEUTROABS 4.7  --   HGB 10.5* 9.7*  HCT 36.2 32.2*  MCV 92.1 90.2  PLT 189 163   Cardiac Enzymes: No results for input(s): "CKTOTAL", "CKMB", "CKMBINDEX", "TROPONINI" in the last 168 hours. BNP: Invalid input(s): "POCBNP" CBG: Recent Labs  Lab 05/09/22 0800  GLUCAP 86   D-Dimer No results for input(s): "DDIMER" in  the  last 72 hours. Hgb A1c No results for input(s): "HGBA1C" in the last 72 hours. Lipid Profile No results for input(s): "CHOL", "HDL", "LDLCALC", "TRIG", "CHOLHDL", "LDLDIRECT" in the last 72 hours. Thyroid function studies No results for input(s): "TSH", "T4TOTAL", "T3FREE", "THYROIDAB" in the last 72 hours.  Invalid input(s): "FREET3" Anemia work up No results for input(s): "VITAMINB12", "FOLATE", "FERRITIN", "TIBC", "IRON", "RETICCTPCT" in the last 72 hours. Urinalysis    Component Value Date/Time   COLORURINE YELLOW (A) 05/09/2022 0157   APPEARANCEUR CLEAR (A) 05/09/2022 0157   APPEARANCEUR Clear 09/28/2014 0200   LABSPEC 1.017 05/09/2022 0157   LABSPEC 1.017 09/28/2014 0200   PHURINE 5.0 05/09/2022 0157   GLUCOSEU 50 (A) 05/09/2022 0157   GLUCOSEU Negative 09/28/2014 0200   HGBUR NEGATIVE 05/09/2022 0157   BILIRUBINUR NEGATIVE 05/09/2022 0157   BILIRUBINUR Negative 09/28/2014 0200   KETONESUR NEGATIVE 05/09/2022 0157   PROTEINUR NEGATIVE 05/09/2022 0157   NITRITE NEGATIVE 05/09/2022 0157   LEUKOCYTESUR NEGATIVE 05/09/2022 0157   LEUKOCYTESUR Negative 09/28/2014 0200   Sepsis Labs Recent Labs  Lab 05/08/22 2141 05/09/22 0631  WBC 6.6 8.2   Microbiology Recent Results (from the past 240 hour(s))  SARS Coronavirus 2 by RT PCR (hospital order, performed in [facility] hospital lab) *cepheid single result test* Anterior Nasal Swab     Status: None   Collection Time: 05/08/22  9:41 PM   Specimen: Anterior Nasal Swab  Result Value Ref Range Status   SARS Coronavirus 2 by RT PCR NEGATIVE NEGATIVE Final    Comment: (NOTE) SARS-CoV-2 target nucleic acids are NOT DETECTED.  The SARS-CoV-2 RNA is generally detectable in upper and lower respiratory specimens during the acute phase of infection. The lowest concentration of SARS-CoV-2 viral copies this assay can detect is 250 copies / mL. A negative result does not preclude SARS-CoV-2 infection and should not be used as  the sole basis for treatment or other patient management decisions.  A negative result may occur with improper specimen collection / handling, submission of specimen other than nasopharyngeal swab, presence of viral mutation(s) within the areas targeted by this assay, and inadequate number of viral copies (<250 copies / mL). A negative result must be combined with clinical observations, patient history, and epidemiological information.  Fact Sheet for Patients:   RoadLapTop.co.zahttps://www.fda.gov/media/158405/download  Fact Sheet for Healthcare Providers: http://kim-miller.com/https://www.fda.gov/media/158404/download  This test is not yet approved or  cleared by the [place] FDA and has been authorized for detection and/or diagnosis of SARS-CoV-2 by FDA under an Emergency Use Authorization (EUA).  This EUA will remain in effect (meaning this test can be used) for the duration of the COVID-19 declaration under Section 564(b)(1) of the Act, 21 U.S.C. section 360bbb-3(b)(1), unless the authorization is terminated or revoked sooner.  Performed at [facility], [address] [place], [place] [address]   Blood culture (routine x 2)     Status: None (Preliminary result)   Collection Time: 05/08/22  9:41 PM   Specimen: BLOOD  Result Value Ref Range Status   Specimen Description BLOOD RIGTH WRIST  Final   Special Requests   Final    BOTTLES DRAWN AEROBIC AND ANAEROBIC Blood Culture results may not be optimal due to an inadequate volume of blood received in culture bottles   Culture   Final    NO GROWTH < 12 HOURS Performed at [facility], [address] [place], [place] [address]    Report Status PENDING  Incomplete  Blood culture (routine x  2)     Status: None (Preliminary result)   Collection Time: 05/08/22  9:41 PM   Specimen: BLOOD  Result Value Ref Range Status   Specimen Description BLOOD LEFT AC  Final   Special Requests   Final    BOTTLES DRAWN AEROBIC AND ANAEROBIC Blood Culture  results may not be optimal due to an inadequate volume of blood received in culture bottles   Culture   Final    NO GROWTH < 12 HOURS Performed at [facility], [address], [place], [place] [address]    Report Status PENDING  Incomplete   Imaging CT Chest Wo Contrast  Result Date: 05/09/2022 CLINICAL DATA:  Difficulty breathing. EXAM: CT CHEST WITHOUT CONTRAST TECHNIQUE: Multidetector CT imaging of the chest was performed following the standard protocol without IV contrast. RADIATION DOSE REDUCTION: This exam was performed according to the departmental dose-optimization program which includes automated exposure control, adjustment of the mA and/or kV according to patient size and/or use of iterative reconstruction technique. COMPARISON:  None Available. FINDINGS: Cardiovascular: There is marked severity calcification of the aortic arch. 3.5 cm focal aneurysmal dilatation of the ascending to mid aortic arch is also seen. The ascending thoracic aorta measures approximately 4.4 cm in diameter. There is mild cardiomegaly with moderate to marked severity coronary artery calcification. A small amount of pericardial fluid is noted. Mediastinum/Nodes: Subcentimeter calcified and noncalcified pretracheal lymph nodes are seen. Evaluation for the presence of additional lymph nodes is limited in the absence of intravenous contrast. Thyroid gland, trachea, and esophagus demonstrate no significant findings. Lungs/Pleura: There is left-sided volume loss with mild to moderate severity scarring, atelectasis and/or infiltrate seen within the left upper lobe. Mild, similar appearing changes are also seen within the left lower lobe and anteromedial aspect of the right upper lobe. Mild to moderate severity peribronchial thickening is seen along the anteromedial aspect of the right middle lobe and posteromedial aspect of the right lower lobe. Moderate to marked severity elevation of the right hemidiaphragm is  seen. There is no evidence of a pleural effusion or pneumothorax. Upper Abdomen: There is a large gastric hernia with subsequent intrathoracic location of the stomach. Noninflamed diverticula are seen throughout the large bowel. Musculoskeletal: A chronic fracture deformity is seen involving the manubrium of the sternum. Multilevel degenerative changes seen throughout the thoracic spine. IMPRESSION: 1. Bilateral areas of scarring, atelectasis and/or infiltrate, most prominent within the left upper lobe. 2. Mild to moderate severity right middle lobe and right lower lobe peribronchial thickening which may be chronic in nature. 3. Dilatation of the ascending thoracic aorta with 3.5 cm focal aneurysmal dilatation of the ascending to mid aortic arch. 4. Large gastric hernia with subsequent intrathoracic location of the stomach. 5. Moderate to marked severity elevation of the right hemidiaphragm. 6. Colonic diverticulosis. Aortic Atherosclerosis (ICD10-I70.0). Electronically Signed   By: Aram Candela M.D.   On: 05/09/2022 03:44   US Venous Img Lower Bilateral  Result Date: 05/09/2022 CLINICAL DATA:  Bilateral leg swelling EXAM: BILATERAL LOWER EXTREMITY VENOUS DOPPLER ULTRASOUND TECHNIQUE: Gray-scale sonography with compression, as well as color and duplex ultrasound, were performed to evaluate the deep venous system(s) from the level of the common femoral vein through the popliteal and proximal calf veins. COMPARISON:  None Available. FINDINGS: VENOUS Normal compressibility of the common femoral, superficial femoral, and popliteal veins, as well as the visualized calf veins. Visualized portions of profunda femoral vein and great saphenous vein unremarkable. No filling defects to suggest DVT on  grayscale or color Doppler imaging. Doppler waveforms show normal direction of venous flow, normal respiratory plasticity and response to augmentation. OTHER Subcutaneous edema in the calves bilaterally, left greater than  right. Limitations: none IMPRESSION: Negative. Electronically Signed   By: Charline Bills M.D.   On: 05/09/2022 00:44   DG Chest Port 1 View  Result Date: 05/08/2022 CLINICAL DATA:  Breathing faster EXAM: PORTABLE CHEST 1 VIEW COMPARISON:  Chest x-ray 03/17/2021 FINDINGS: There is stable mild elevation of the right hemidiaphragm. There is some patchy and strandy opacities in the left mid lung, increased from prior. Right lung is clear. No pleural effusion or pneumothorax. Patient is rotated. The cardiomediastinal silhouette is stable, the heart is mildly enlarged. No acute fractures are seen. IMPRESSION: Increasing patchy strandy and airspace opacities in the left mid lung worrisome for infection. Follow-up chest x-ray recommended in 4-6 weeks to re-evaluate. Electronically Signed   By: Darliss Cheney M.D.   On: 05/08/2022 22:59      Time coordinating discharge: Over 30 minutes  SIGNED:  Sunnie Nielsen DO [facility]

## 2022-05-09 NOTE — ED Notes (Signed)
Pt is better able to respond to commands such as taking oral temperature. Pt is talking to this nurse, sometimes not sure what pt is talking about, but is communicating. Pt placed on 2lpm O2 due to desatting intermittently with decreased pleth during desat. Pt's daughter is bedside.

## 2022-05-09 NOTE — Care Management Obs Status (Signed)
MEDICARE OBSERVATION STATUS NOTIFICATION   Patient Details  Name: Jodi Lewis MRN: [id number] Date of Birth: 15-Jun-1923   Medicare Observation Status Notification Given:  Yes    Allayne Butcher, RN 05/09/2022, 2:22 PM

## 2022-05-10 LAB — LEGIONELLA PNEUMOPHILA SEROGP 1 UR AG: L. pneumophila Serogp 1 Ur Ag: NEGATIVE

## 2022-05-13 LAB — CULTURE, BLOOD (ROUTINE X 2)
Culture: NO GROWTH
Culture: NO GROWTH
